# Patient Record
Sex: Male | Born: 1975 | Race: Black or African American | Hispanic: No | Marital: Single | Smoking: Current every day smoker
Health system: Southern US, Community
[De-identification: ages and names within clinical notes are randomized; demographics above are authoritative.]

## PROBLEM LIST (undated history)

## (undated) DIAGNOSIS — R55 Syncope and collapse: Secondary | ICD-10-CM

## (undated) HISTORY — PX: HERNIA REPAIR: SHX51

---

## 1997-08-24 ENCOUNTER — Emergency Department (HOSPITAL_COMMUNITY): Admission: EM | Admit: 1997-08-24 | Discharge: 1997-08-24 | Payer: Self-pay | Admitting: Emergency Medicine

## 1998-03-11 ENCOUNTER — Encounter (HOSPITAL_BASED_OUTPATIENT_CLINIC_OR_DEPARTMENT_OTHER): Payer: Self-pay | Admitting: General Surgery

## 1998-03-13 ENCOUNTER — Ambulatory Visit (HOSPITAL_COMMUNITY): Admission: RE | Admit: 1998-03-13 | Discharge: 1998-03-13 | Payer: Self-pay | Admitting: General Surgery

## 1999-06-04 ENCOUNTER — Emergency Department (HOSPITAL_COMMUNITY): Admission: EM | Admit: 1999-06-04 | Discharge: 1999-06-04 | Payer: Self-pay | Admitting: Emergency Medicine

## 1999-08-12 ENCOUNTER — Emergency Department (HOSPITAL_COMMUNITY): Admission: EM | Admit: 1999-08-12 | Discharge: 1999-08-13 | Payer: Self-pay | Admitting: Emergency Medicine

## 1999-08-13 ENCOUNTER — Encounter: Payer: Self-pay | Admitting: Emergency Medicine

## 2001-08-17 ENCOUNTER — Encounter: Payer: Self-pay | Admitting: Emergency Medicine

## 2001-08-17 ENCOUNTER — Emergency Department (HOSPITAL_COMMUNITY): Admission: EM | Admit: 2001-08-17 | Discharge: 2001-08-17 | Payer: Self-pay | Admitting: Emergency Medicine

## 2001-08-23 ENCOUNTER — Emergency Department (HOSPITAL_COMMUNITY): Admission: EM | Admit: 2001-08-23 | Discharge: 2001-08-23 | Payer: Self-pay | Admitting: Emergency Medicine

## 2004-06-09 ENCOUNTER — Emergency Department (HOSPITAL_COMMUNITY): Admission: EM | Admit: 2004-06-09 | Discharge: 2004-06-09 | Payer: Self-pay | Admitting: Emergency Medicine

## 2005-03-07 ENCOUNTER — Emergency Department (HOSPITAL_COMMUNITY): Admission: EM | Admit: 2005-03-07 | Discharge: 2005-03-07 | Payer: Self-pay | Admitting: Emergency Medicine

## 2006-03-28 ENCOUNTER — Emergency Department (HOSPITAL_COMMUNITY): Admission: EM | Admit: 2006-03-28 | Discharge: 2006-03-28 | Payer: Self-pay | Admitting: Emergency Medicine

## 2006-04-28 ENCOUNTER — Emergency Department (HOSPITAL_COMMUNITY): Admission: EM | Admit: 2006-04-28 | Discharge: 2006-04-28 | Payer: Self-pay | Admitting: Emergency Medicine

## 2006-05-05 ENCOUNTER — Emergency Department (HOSPITAL_COMMUNITY): Admission: EM | Admit: 2006-05-05 | Discharge: 2006-05-06 | Payer: Self-pay | Admitting: Emergency Medicine

## 2006-12-13 ENCOUNTER — Emergency Department (HOSPITAL_COMMUNITY): Admission: EM | Admit: 2006-12-13 | Discharge: 2006-12-13 | Payer: Self-pay | Admitting: *Deleted

## 2006-12-15 ENCOUNTER — Emergency Department (HOSPITAL_COMMUNITY): Admission: EM | Admit: 2006-12-15 | Discharge: 2006-12-15 | Payer: Self-pay | Admitting: *Deleted

## 2007-07-20 ENCOUNTER — Emergency Department (HOSPITAL_COMMUNITY): Admission: EM | Admit: 2007-07-20 | Discharge: 2007-07-21 | Payer: Self-pay | Admitting: Emergency Medicine

## 2007-07-29 ENCOUNTER — Emergency Department (HOSPITAL_COMMUNITY): Admission: EM | Admit: 2007-07-29 | Discharge: 2007-07-29 | Payer: Self-pay | Admitting: Emergency Medicine

## 2009-01-14 ENCOUNTER — Emergency Department (HOSPITAL_COMMUNITY): Admission: EM | Admit: 2009-01-14 | Discharge: 2009-01-14 | Payer: Self-pay | Admitting: Emergency Medicine

## 2009-04-07 ENCOUNTER — Emergency Department (HOSPITAL_COMMUNITY): Admission: EM | Admit: 2009-04-07 | Discharge: 2009-04-07 | Payer: Self-pay | Admitting: Emergency Medicine

## 2013-01-21 ENCOUNTER — Encounter (HOSPITAL_COMMUNITY): Payer: Self-pay | Admitting: Emergency Medicine

## 2013-01-21 ENCOUNTER — Emergency Department (HOSPITAL_COMMUNITY)
Admission: EM | Admit: 2013-01-21 | Discharge: 2013-01-21 | Disposition: A | Discharge status: Home / Self Care | Payer: Self-pay | Attending: Emergency Medicine | Admitting: Emergency Medicine

## 2013-01-21 DIAGNOSIS — R209 Unspecified disturbances of skin sensation: Secondary | ICD-10-CM | POA: Insufficient documentation

## 2013-01-21 DIAGNOSIS — F17200 Nicotine dependence, unspecified, uncomplicated: Secondary | ICD-10-CM | POA: Insufficient documentation

## 2013-01-21 DIAGNOSIS — W503XXA Accidental bite by another person, initial encounter: Secondary | ICD-10-CM

## 2013-01-21 DIAGNOSIS — Z23 Encounter for immunization: Secondary | ICD-10-CM | POA: Insufficient documentation

## 2013-01-21 DIAGNOSIS — S61209A Unspecified open wound of unspecified finger without damage to nail, initial encounter: Secondary | ICD-10-CM | POA: Insufficient documentation

## 2013-01-21 DIAGNOSIS — M79609 Pain in unspecified limb: Secondary | ICD-10-CM | POA: Insufficient documentation

## 2013-01-21 DIAGNOSIS — M79644 Pain in right finger(s): Secondary | ICD-10-CM

## 2013-01-21 MED ORDER — CIPROFLOXACIN HCL 500 MG PO TABS: 500.0000 mg | ORAL_TABLET | Freq: Two times a day (BID) | ORAL | Status: DC

## 2013-01-21 MED ORDER — POVIDONE-IODINE 10 % EX SOLN: CUTANEOUS | Status: DC | PRN

## 2013-01-21 MED ORDER — CLINDAMYCIN HCL 150 MG PO CAPS: 300.0000 mg | ORAL_CAPSULE | Freq: Four times a day (QID) | ORAL | Status: DC

## 2013-01-21 MED ORDER — TETANUS-DIPHTH-ACELL PERTUSSIS 5-2.5-18.5 LF-MCG/0.5 IM SUSP
0.5000 mL | Freq: Once | INTRAMUSCULAR | Status: AC
  Administered 2013-01-21: 0.5 mL via INTRAMUSCULAR
  Filled 2013-01-21: qty 0.5
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
## 2013-01-21 NOTE — ED Provider Notes (Signed)
Medical screening examination/treatment/procedure(s) were conducted as a shared visit with non-physician practitioner(s) and myself.  I personally evaluated the patient during the encounter.  EKG Interpretation   None         Joya Gaskins, MD 01/21/13 [phone]
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
## 2013-01-21 NOTE — ED Notes (Signed)
pts finger rt thumb scrubbed w/ betadine and dressed w/ dsd

## 2013-11-13 ENCOUNTER — Ambulatory Visit (HOSPITAL_COMMUNITY): Admission: EM | Admit: 2013-11-13 | Payer: Self-pay | Source: Home / Self Care | Admitting: Emergency Medicine

## 2013-11-13 ENCOUNTER — Emergency Department (HOSPITAL_COMMUNITY): Payer: Self-pay

## 2013-11-13 ENCOUNTER — Inpatient Hospital Stay (HOSPITAL_COMMUNITY)
Admission: EM | Admit: 2013-11-13 | Discharge: 2013-11-15 | DRG: 312 | Disposition: A | Discharge status: Home / Self Care | Payer: Self-pay | Attending: Internal Medicine | Admitting: Internal Medicine

## 2013-11-13 ENCOUNTER — Encounter (HOSPITAL_COMMUNITY): Payer: Self-pay | Admitting: Emergency Medicine

## 2013-11-13 DIAGNOSIS — Z23 Encounter for immunization: Secondary | ICD-10-CM

## 2013-11-13 DIAGNOSIS — F1010 Alcohol abuse, uncomplicated: Secondary | ICD-10-CM | POA: Diagnosis present

## 2013-11-13 DIAGNOSIS — F1490 Cocaine use, unspecified, uncomplicated: Secondary | ICD-10-CM

## 2013-11-13 DIAGNOSIS — F1410 Cocaine abuse, uncomplicated: Secondary | ICD-10-CM | POA: Diagnosis present

## 2013-11-13 DIAGNOSIS — F17200 Nicotine dependence, unspecified, uncomplicated: Secondary | ICD-10-CM | POA: Diagnosis present

## 2013-11-13 DIAGNOSIS — R55 Syncope and collapse: Principal | ICD-10-CM | POA: Diagnosis present

## 2013-11-13 HISTORY — DX: Syncope and collapse: R55

## 2013-11-13 LAB — I-STAT TROPONIN, ED: TROPONIN I, POC: 0.01 ng/mL (ref 0.00–0.08)

## 2013-11-13 LAB — CBC
HEMATOCRIT: 42.5 % (ref 39.0–52.0)
HEMOGLOBIN: 14.3 g/dL (ref 13.0–17.0)
MCH: 25.3 pg — ABNORMAL LOW (ref 26.0–34.0)
MCHC: 33.6 g/dL (ref 30.0–36.0)
MCV: 75.2 fL — ABNORMAL LOW (ref 78.0–100.0)
Platelets: 231 10*3/uL (ref 150–400)
RBC: 5.65 MIL/uL (ref 4.22–5.81)
RDW: 14.9 % (ref 11.5–15.5)
WBC: 5.4 10*3/uL (ref 4.0–10.5)

## 2013-11-13 LAB — CBG MONITORING, ED: Glucose-Capillary: 150 mg/dL — ABNORMAL HIGH (ref 70–99)

## 2013-11-13 SURGERY — LEFT HEART CATH
Anesthesia: LOCAL

## 2013-11-13 NOTE — ED Notes (Signed)
Per EMS: pt coming from home with c/o syncopal episode. Upon EMS arrival pt was diaphoretic, EKG showed ST elevation. Pt admits to cocaine abuse. Pt denies chest pains. Pt c/o neck pain. Pt was given 325 asa.

## 2013-11-13 NOTE — ED Notes (Signed)
Cardiologist at bedside. Code STEMI cancelled
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
## 2013-11-13 NOTE — ED Provider Notes (Signed)
CSN: [id number]     Arrival date & time 11/13/13  2252 History   First MD Initiated Contact with Patient 11/13/13 2257     Chief Complaint  Patient presents with  . Code STEMI    Patient is a 38 y.o. male presenting with syncope. The history is provided by the patient and the EMS personnel.  Loss of Consciousness Episode history:  Single Most recent episode:  Today Duration:  1 hour Progression:  Resolved Chronicity:  New Context comment:  Eating sitting down Witnessed: yes   Relieved by: rest. Associated symptoms: diaphoresis, nausea (resolved) and visual change (room closed in)   Associated symptoms: no chest pain, no difficulty breathing, no dizziness, no fever, no headaches, no malaise/fatigue, no seizures, no shortness of breath, no vomiting and no weakness    Pt endorsed cocaine use yesterday, no drug or alcohol use today. Syncopized witnessed at rest.  EMS state pt was very diaphoretic on arrival and hypotensive with improvement in BP without intervention.  ASA  given. ST elevations noted on EKG so in coded with STEMI.  Besides midline neck pain, pt has no symptoms currently.  No leg swelling, cough, fevers, hemoptysis. Pt states he has never had an EKG before.    History reviewed. No pertinent past medical history. History reviewed. No pertinent past surgical history. History reviewed. No pertinent family history. History  Substance Use Topics  . Smoking status: Current Every Day Smoker -- 0.50 packs/day    Types: Cigarettes  . Smokeless tobacco: Not on file  . Alcohol Use: Yes    Review of Systems  Constitutional: Positive for diaphoresis. Negative for fever, chills and malaise/fatigue.  Respiratory: Negative for cough, shortness of breath and wheezing.   Cardiovascular: Positive for syncope. Negative for chest pain.  Gastrointestinal: Positive for nausea (resolved). Negative for vomiting and abdominal pain.  Musculoskeletal: Positive for neck pain. Negative for  back pain and gait problem.  Skin: Negative for rash.  Neurological: Positive for syncope and light-headedness. Negative for dizziness, seizures, facial asymmetry, weakness, numbness and headaches.  All other systems reviewed and are negative.   Allergies  Review of patient's allergies indicates no known allergies.  Home Medications   Prior to Admission medications   Medication Sig Start Date End Date Taking? Authorizing Provider  ciprofloxacin (CIPRO) 500 MG tablet Take 1 tablet (500 mg total) by mouth 2 (two) times daily. One po bid x 7 days 01/21/13   Mora Bellman, PA-C  clindamycin (CLEOCIN) 150 MG capsule Take 2 capsules (300 mg total) by mouth 4 (four) times daily. May dispense as  capsules 01/21/13   Ramon Dredge Merrell, PA-C   BP 132/70  Pulse 74  Resp 24  Ht  (1.93 m)  Wt 165 lb (74.844 kg)  BMI 20.09 kg/m2  SpO2 100% Physical Exam  Nursing note and vitals reviewed. Constitutional: He is oriented to person, place, and time. He appears well-developed and well-nourished. No distress.  HENT:  Head: Normocephalic and atraumatic.  Nose: Nose normal.  Eyes: Conjunctivae are normal. Pupils are equal, round, and reactive to light.  Neck: Normal range of motion. Neck supple. No tracheal deviation present.  Collared, lateral and midline neck pain  Cardiovascular: Normal rate, regular rhythm and normal heart sounds.   No murmur heard. Pulmonary/Chest: Effort normal and breath sounds normal. No respiratory distress. He has no wheezes. He has no rales. He exhibits no tenderness.  Abdominal: Soft. Bowel sounds are normal. He exhibits no distension and no  mass. There is no tenderness.  Musculoskeletal: Normal range of motion. He exhibits no edema.  No lower extremity edema, calf tenderness, warmth, erythema or palpable cords  Neurological: He is alert and oriented to person, place, and time.  Normal strength and sensation. DTRs equal 2+. Toes flexor  Skin: Skin is warm and  dry. No rash noted. He is not diaphoretic.  Psychiatric: He has a normal mood and affect.    ED Course  Procedures (including critical care time) Labs Review Labs Reviewed  CBC - Abnormal; Notable for the following:    MCV 75.2 (*)    MCH 25.3 (*)    All other components within normal limits  BASIC METABOLIC PANEL - Abnormal; Notable for the following:    Glucose, Bld 150 (*)    GFR calc non Af Amer 71 (*)    GFR calc Af Amer 82 (*)    All other components within normal limits  CBG MONITORING, ED - Abnormal; Notable for the following:    Glucose-Capillary 150 (*)    All other components within normal limits  I-STAT TROPOININ, ED    Imaging Review Ct Head Wo Contrast  11/14/2013   CLINICAL DATA:  Code STEMI.  Fall.  EXAM: CT HEAD WITHOUT CONTRAST  CT CERVICAL SPINE WITHOUT CONTRAST  TECHNIQUE: Multidetector CT imaging of the head and cervical spine was performed following the standard protocol without intravenous contrast. Multiplanar CT image reconstructions of the cervical spine were also generated.  COMPARISON:  None currently available  FINDINGS: CT HEAD FINDINGS  Skull and Sinuses:Negative for fracture or destructive process. The mastoids, middle ears, and imaged paranasal sinuses are clear.  Orbits: No acute abnormality.  Brain: No evidence of acute abnormality, such as acute infarction, hemorrhage, hydrocephalus, or mass lesion/mass effect.  CT CERVICAL SPINE FINDINGS  Negative for acute fracture or subluxation. No prevertebral edema. No gross cervical canal hematoma. Mild C3-4 disc narrowing and bulging with early uncovertebral spurring. No significant osseous canal or foraminal stenosis.  IMPRESSION: Negative head and cervical spine CT.   Electronically Signed   By: Tiburcio Pea M.D.   On: 11/14/2013 00:04   Ct Cervical Spine Wo Contrast  11/14/2013   CLINICAL DATA:  Code STEMI.  Fall.  EXAM: CT HEAD WITHOUT CONTRAST  CT CERVICAL SPINE WITHOUT CONTRAST  TECHNIQUE: Multidetector  CT imaging of the head and cervical spine was performed following the standard protocol without intravenous contrast. Multiplanar CT image reconstructions of the cervical spine were also generated.  COMPARISON:  None currently available  FINDINGS: CT HEAD FINDINGS  Skull and Sinuses:Negative for fracture or destructive process. The mastoids, middle ears, and imaged paranasal sinuses are clear.  Orbits: No acute abnormality.  Brain: No evidence of acute abnormality, such as acute infarction, hemorrhage, hydrocephalus, or mass lesion/mass effect.  CT CERVICAL SPINE FINDINGS  Negative for acute fracture or subluxation. No prevertebral edema. No gross cervical canal hematoma. Mild C3-4 disc narrowing and bulging with early uncovertebral spurring. No significant osseous canal or foraminal stenosis.  IMPRESSION: Negative head and cervical spine CT.   Electronically Signed   By: Tiburcio Pea M.D.   On: 11/14/2013 00:04   Dg Chest Portable 1 View  11/13/2013   CLINICAL DATA:  Code STEMI.  EXAM: PORTABLE CHEST - 1 VIEW  COMPARISON:  05/06/2006.  FINDINGS: Normal heart size and mediastinal contours. No acute infiltrate or edema. No effusion or pneumothorax. No acute osseous findings.  IMPRESSION: Negative chest.   Electronically Signed  By: Tiburcio Pea M.D.   On: 11/13/2013 23:38     EKG Interpretation   Date/Time:  Wednesday November 13 2013 22:56:25 EDT Ventricular Rate:  75 PR Interval:  136 QRS Duration: 78 QT Interval:  373 QTC Calculation: 417 R Axis:   68 Text Interpretation:  Sinus rhythm Left ventricular hypertrophy ST  elevation, consider anterolateral injury - similar to a few minutes prior  Confirmed by Gwendolyn Grant  MD, BLAIR ([id number]) on 11/13/2013 11:29:50 PM      MDM   Final diagnoses:  Vasovagal syncope  Cocaine use    Pt with substance abuse who presents s/p syncope at rest.  Diaphoresis and hypotension rapidly improved without intervention. Pt is currently asymptomatic.   Cardiology at bedside on arrival.  Pads placed.  EKG obtained with diffuse J point elevations, no reciprocal changes. Pt has no prior EKGs to compare.   Cardiology canceled code STEMI. Recommend admission for further obs and delta trop. Would expect cocaine to be metabolized by this point from prior use.  No evidence of DVT, no hemoptysis, pleurisy, chest pain, hypoxia or tachypnea to indicate PE.  No chest pain or PR depression to indicate pericarditis. VSS, well appearing.  Larey Seat and struck head after syncope and notes neck pain. Images obtained.    12:25 AM images negative. Cervical spine cleared. Pain is lateral neck, suspect cervical strain.  Cardiology recommends admission for further observation and cycle troponins. Hospitalist called and will admit. Pt is chest pain free and asymptomatic. VSS.     Sofie Rower, MD 11/14/13 [phone]

## 2013-11-14 ENCOUNTER — Encounter (HOSPITAL_COMMUNITY): Payer: Self-pay | Admitting: General Practice

## 2013-11-14 DIAGNOSIS — R55 Syncope and collapse: Principal | ICD-10-CM

## 2013-11-14 LAB — BASIC METABOLIC PANEL
Anion gap: 15 (ref 5–15)
Anion gap: 13 (ref 5–15)
BUN: 18 mg/dL (ref 6–23)
BUN: 19 mg/dL (ref 6–23)
CHLORIDE: 98 meq/L (ref 96–112)
CHLORIDE: 101 meq/L (ref 96–112)
CO2: 24 mEq/L (ref 19–32)
CO2: 27 mEq/L (ref 19–32)
Calcium: 9.5 mg/dL (ref 8.4–10.5)
Calcium: 10 mg/dL (ref 8.4–10.5)
Creatinine, Ser: 1.26 mg/dL (ref 0.50–1.35)
Creatinine, Ser: 1.29 mg/dL (ref 0.50–1.35)
GFR calc Af Amer: 82 mL/min — ABNORMAL LOW (ref >90)
GFR calc Af Amer: 80 mL/min — ABNORMAL LOW (ref >90)
GFR calc non Af Amer: 69 mL/min — ABNORMAL LOW (ref >90)
GFR, EST NON AFRICAN AMERICAN: 71 mL/min — AB (ref >90)
GLUCOSE: 71 mg/dL (ref 70–99)
Glucose, Bld: 150 mg/dL — ABNORMAL HIGH (ref 70–99)
POTASSIUM: 4 meq/L (ref 3.7–5.3)
POTASSIUM: 5 meq/L (ref 3.7–5.3)
SODIUM: 137 meq/L (ref 137–147)
SODIUM: 141 meq/L (ref 137–147)

## 2013-11-14 LAB — CBC
HCT: 41.8 % (ref 39.0–52.0)
Hemoglobin: 13.7 g/dL (ref 13.0–17.0)
MCH: 25.4 pg — AB (ref 26.0–34.0)
MCHC: 32.8 g/dL (ref 30.0–36.0)
MCV: 77.4 fL — ABNORMAL LOW (ref 78.0–100.0)
PLATELETS: 240 10*3/uL (ref 150–400)
RBC: 5.4 MIL/uL (ref 4.22–5.81)
RDW: 15.2 % (ref 11.5–15.5)
WBC: 7.4 10*3/uL (ref 4.0–10.5)

## 2013-11-14 LAB — LIPID PANEL
Cholesterol: 162 mg/dL (ref 0–200)
HDL: 87 mg/dL (ref >39)
LDL Cholesterol: 64 mg/dL (ref 0–99)
Total CHOL/HDL Ratio: 1.9 RATIO
Triglycerides: 54 mg/dL (ref <150)
VLDL: 11 mg/dL (ref 0–40)

## 2013-11-14 LAB — HEMOGLOBIN A1C
HEMOGLOBIN A1C: 5.2 % (ref <5.7)
MEAN PLASMA GLUCOSE: 103 mg/dL (ref <117)

## 2013-11-14 LAB — TROPONIN I
Troponin I: <0.3 ng/mL (ref <0.30)
Troponin I: <0.3 ng/mL (ref <0.30)
Troponin I: <0.3 ng/mL (ref <0.30)

## 2013-11-14 LAB — CREATININE, SERUM
CREATININE: 1.29 mg/dL (ref 0.50–1.35)
GFR calc Af Amer: 80 mL/min — ABNORMAL LOW (ref >90)
GFR calc non Af Amer: 69 mL/min — ABNORMAL LOW (ref >90)

## 2013-11-14 LAB — TSH: TSH: 0.12 u[IU]/mL — AB (ref 0.350–4.500)

## 2013-11-14 LAB — HIV ANTIBODY (ROUTINE TESTING W REFLEX): HIV 1&2 Ab, 4th Generation: NONREACTIVE

## 2013-11-14 LAB — T4, FREE: FREE T4: 1.01 ng/dL (ref 0.80–1.80)

## 2013-11-14 MED ORDER — ADULT MULTIVITAMIN W/MINERALS CH
1.0000 | ORAL_TABLET | Freq: Every day | ORAL | Status: DC
  Administered 2013-11-14 – 2013-11-15 (×2): 1 via ORAL
  Filled 2013-11-14 (×2): qty 1

## 2013-11-14 MED ORDER — LORAZEPAM 2 MG/ML IJ SOLN: 1.0000 mg | Freq: Four times a day (QID) | INTRAMUSCULAR | Status: DC | PRN

## 2013-11-14 MED ORDER — SODIUM CHLORIDE 0.9 % IJ SOLN
3.0000 mL | Freq: Two times a day (BID) | INTRAMUSCULAR | Status: DC
  Administered 2013-11-15 (×2): 3 mL via INTRAVENOUS

## 2013-11-14 MED ORDER — INFLUENZA VAC SPLIT QUAD 0.5 ML IM SUSY
0.5000 mL | PREFILLED_SYRINGE | INTRAMUSCULAR | Status: AC
  Administered 2013-11-15: 0.5 mL via INTRAMUSCULAR
  Filled 2013-11-14: qty 0.5

## 2013-11-14 MED ORDER — VITAMIN B-1 100 MG PO TABS
100.0000 mg | ORAL_TABLET | Freq: Every day | ORAL | Status: DC
  Administered 2013-11-14 – 2013-11-15 (×2): 100 mg via ORAL
  Filled 2013-11-14 (×2): qty 1

## 2013-11-14 MED ORDER — SODIUM CHLORIDE 0.9 % IV SOLN: 250.0000 mL | INTRAVENOUS | Status: DC | PRN

## 2013-11-14 MED ORDER — TRAMADOL HCL 50 MG PO TABS
50.0000 mg | ORAL_TABLET | Freq: Four times a day (QID) | ORAL | Status: DC | PRN
  Administered 2013-11-14 (×2): 50 mg via ORAL
  Filled 2013-11-14 (×2): qty 1

## 2013-11-14 MED ORDER — LORAZEPAM 1 MG PO TABS
1.0000 mg | ORAL_TABLET | Freq: Four times a day (QID) | ORAL | Status: DC | PRN
  Administered 2013-11-15: 1 mg via ORAL
  Filled 2013-11-14: qty 1

## 2013-11-14 MED ORDER — PNEUMOCOCCAL VAC POLYVALENT 25 MCG/0.5ML IJ INJ
0.5000 mL | INJECTION | INTRAMUSCULAR | Status: AC
  Administered 2013-11-15: 0.5 mL via INTRAMUSCULAR
  Filled 2013-11-14: qty 0.5

## 2013-11-14 MED ORDER — ENOXAPARIN SODIUM 40 MG/0.4ML ~~LOC~~ SOLN
40.0000 mg | SUBCUTANEOUS | Status: DC
  Administered 2013-11-14 – 2013-11-15 (×2): 40 mg via SUBCUTANEOUS
  Filled 2013-11-14 (×4): qty 0.4

## 2013-11-14 MED ORDER — SODIUM CHLORIDE 0.9 % IJ SOLN
3.0000 mL | Freq: Two times a day (BID) | INTRAMUSCULAR | Status: DC
  Administered 2013-11-15 (×2): 3 mL via INTRAVENOUS

## 2013-11-14 MED ORDER — SODIUM CHLORIDE 0.9 % IJ SOLN: 3.0000 mL | INTRAMUSCULAR | Status: DC | PRN

## 2013-11-14 MED ORDER — NICOTINE 14 MG/24HR TD PT24
14.0000 mg | MEDICATED_PATCH | Freq: Every day | TRANSDERMAL | Status: DC
  Administered 2013-11-14 – 2013-11-15 (×2): 14 mg via TRANSDERMAL
  Filled 2013-11-14 (×2): qty 1

## 2013-11-14 MED ORDER — FOLIC ACID 1 MG PO TABS
1.0000 mg | ORAL_TABLET | Freq: Every day | ORAL | Status: DC
  Administered 2013-11-14 – 2013-11-15 (×2): 1 mg via ORAL
  Filled 2013-11-14 (×2): qty 1

## 2013-11-14 MED ORDER — THIAMINE HCL 100 MG/ML IJ SOLN: 100.0000 mg | Freq: Every day | INTRAMUSCULAR | Status: DC

## 2013-11-14 NOTE — Progress Notes (Addendum)
Patient admitted after midnight- please see H&P- in study Syncope - A1C, lipids. CUS. TTE. Tele overnight. Cycle trop.  tob abuse - nicotine patc etoh abuse - ciwa  Drug abuse - UDS , social work consult Fall - CT head / neck unremarkable  TSH decreased- add free t4  Suspect d/c tomm  Marlin Canary DO

## 2013-11-14 NOTE — Progress Notes (Signed)
  Echocardiogram 2D Echocardiogram has been performed.  Cathie Beams 11/14/2013, 12:20 PM

## 2013-11-14 NOTE — Progress Notes (Signed)
Alert and oriented times 4, able to communicate needs, will continue to monitor

## 2013-11-14 NOTE — Care Management Note (Signed)
    Page 1 of 1   11/14/2013     2:39:53 PM CARE MANAGEMENT NOTE 11/14/2013  Patient:  Michael Rowland, Michael Rowland   Account Number:  [id number]  Date Initiated:  11/14/2013  Documentation initiated by:  [facility]  Subjective/Objective Assessment:   38 yo presents w/ syncope while eating a meal. Larey Seat from his chair and awoke on the floor.// Home alone.     Action/Plan:   A1C, lipids. CUS. TTE. Tele overnight. Cycle trop.  tob abuse - nicotine patch if needed  etoh abuse - ciwa  Drug abuse - UDS  //Access for disposition needs, PCP set-up.   Anticipated DC Date:  11/15/2013   Anticipated DC Plan:  HOME/SELF CARE  In-house referral  Clinical Social Worker      DC Planning Services  CM consult  Medication Assistance      Choice offered to / List presented to:             Status of service:  In process, will continue to follow Medicare Important Message given?   (If response is "NO", the following Medicare IM given date fields will be blank) Date Medicare IM given:   Medicare IM given by:   Date Additional Medicare IM given:   Additional Medicare IM given by:    Discharge Disposition:    Per UR Regulation:  Reviewed for med. necessity/level of care/duration of stay  If discussed at Long Length of Stay Meetings, dates discussed:    Comments:

## 2013-11-14 NOTE — H&P (Signed)
[facility]  History and Physical Mariko Nowakowski L. Link Snuffer, MD Pager [phone] (if 7P to 7A, page night hospitalist on [email or web address] MONDAY AVW:[phone] DOB: [birth year] DOA: 11/13/2013  Referring physician: ED PCP: No primary provider on file.   Chief Complaint: syncope  HPI:  Pt w/ minimal known PMH w/o a prior PCP so minimal prior health exams presents w/ syncope while eating a meal. Fell from his chair and awoke on the floor. Never had CP, dyspnea, pain. Did have diaphoresis. No N/V. On EKG had diffuse J point elevation and code stemi was evaluated and called off by cardiology. He does have hx of drug use, most recent cocaine was 1 day prior. Etoh use current. tob use current at 1 PPD. Unknown if has other cardiac risk factors.   Chart Review:  ED chart review   Review of Systems:  Neg except as noted above   History reviewed. No pertinent past medical history.  History reviewed. No pertinent past surgical history.  Social History:  reports that he has been smoking Cigarettes.  He has been smoking about 0.50 packs per day. He does not have any smokeless tobacco history on file. He reports that he drinks alcohol. He reports that he uses illicit drugs (Cocaine).  No Known Allergies  History reviewed. No pertinent family history.   Prior to Admission medications   Not on File   Physical Exam: Filed Vitals:   11/13/13 2259  BP: 132/70  Pulse: 74  Resp: 24  Height:  (1.93 m)  Weight: 165 lb (74.844 kg)  SpO2: 100%     General:  AAM in NAD   HEENT: MMM, anicteric, trachea midlien  Cardiovascular: RRR,  No MRG   Respiratory: CTAB, no wheezes, rales   Abdomen: soft, NT, ND  Skin: warm , dry   Musculoskeletal: no focal deficits   Psychiatric: no anxiety   Neurologic: no focal deficits   Wt Readings from Last 3 Encounters:  11/13/13 165 lb (74.844 kg)  01/21/13 158 lb (71.668 kg)    Labs on Admission:  Basic Metabolic Panel:  Recent  Labs Lab 11/13/13 2308  NA 137  K 4.0  CL 98  CO2 24  GLUCOSE 150*  BUN 18  CREATININE 1.26  CALCIUM 9.5    Liver Function Tests: No results found for this basename: AST, ALT, ALKPHOS, BILITOT, PROT, ALBUMIN,  in the last 168 hours No results found for this basename: LIPASE, AMYLASE,  in the last 168 hours No results found for this basename: AMMONIA,  in the last 168 hours  CBC:  Recent Labs Lab 11/13/13 2308  WBC 5.4  HGB 14.3  HCT 42.5  MCV 75.2*  PLT 231    Cardiac Enzymes: No results found for this basename: CKTOTAL, CKMB, CKMBINDEX, TROPONINI,  in the last 168 hours  Troponin (Point of Care Test)  Recent Labs  11/13/13 2304  TROPIPOC 0.01    BNP (last 3 results) No results found for this basename: PROBNP,  in the last 8760 hours  CBG:  Recent Labs Lab 11/13/13 2305  GLUCAP 150*     Radiological Exams on Admission: Ct Head Wo Contrast  11/14/2013   CLINICAL DATA:  Code STEMI.  Fall.  EXAM: CT HEAD WITHOUT CONTRAST  CT CERVICAL SPINE WITHOUT CONTRAST  TECHNIQUE: Multidetector CT imaging of the head and cervical spine was performed following the standard protocol without intravenous contrast. Multiplanar CT image reconstructions of the cervical spine were also generated.  COMPARISON:  None currently available  FINDINGS: CT HEAD FINDINGS  Skull and Sinuses:Negative for fracture or destructive process. The mastoids, middle ears, and imaged paranasal sinuses are clear.  Orbits: No acute abnormality.  Brain: No evidence of acute abnormality, such as acute infarction, hemorrhage, hydrocephalus, or mass lesion/mass effect.  CT CERVICAL SPINE FINDINGS  Negative for acute fracture or subluxation. No prevertebral edema. No gross cervical canal hematoma. Mild C3-4 disc narrowing and bulging with early uncovertebral spurring. No significant osseous canal or foraminal stenosis.  IMPRESSION: Negative head and cervical spine CT.   Electronically Signed   By: Tiburcio Pea  M.D.   On: 11/14/2013 00:04   Ct Cervical Spine Wo Contrast  11/14/2013   CLINICAL DATA:  Code STEMI.  Fall.  EXAM: CT HEAD WITHOUT CONTRAST  CT CERVICAL SPINE WITHOUT CONTRAST  TECHNIQUE: Multidetector CT imaging of the head and cervical spine was performed following the standard protocol without intravenous contrast. Multiplanar CT image reconstructions of the cervical spine were also generated.  COMPARISON:  None currently available  FINDINGS: CT HEAD FINDINGS  Skull and Sinuses:Negative for fracture or destructive process. The mastoids, middle ears, and imaged paranasal sinuses are clear.  Orbits: No acute abnormality.  Brain: No evidence of acute abnormality, such as acute infarction, hemorrhage, hydrocephalus, or mass lesion/mass effect.  CT CERVICAL SPINE FINDINGS  Negative for acute fracture or subluxation. No prevertebral edema. No gross cervical canal hematoma. Mild C3-4 disc narrowing and bulging with early uncovertebral spurring. No significant osseous canal or foraminal stenosis.  IMPRESSION: Negative head and cervical spine CT.   Electronically Signed   By: Tiburcio Pea M.D.   On: 11/14/2013 00:04   Dg Chest Portable 1 View  11/13/2013   CLINICAL DATA:  Code STEMI.  EXAM: PORTABLE CHEST - 1 VIEW  COMPARISON:  05/06/2006.  FINDINGS: Normal heart size and mediastinal contours. No acute infiltrate or edema. No effusion or pneumothorax. No acute osseous findings.  IMPRESSION: Negative chest.   Electronically Signed   By: Tiburcio Pea M.D.   On: 11/13/2013 23:38     Active Problems:   * No active hospital problems. *   Assessment/Plan 1. Syncope - A1C, lipids. CUS. TTE. Tele overnight. Cycle trop.  2. tob abuse - nicotine patch if needed 3. etoh abuse - ciwa  4. Drug abuse - UDS  5. Fall - CT head / neck unremarkable .   Code Status: full Family Communication: none Disposition Plan/Anticipated LOS: 1-2 days   Time spent: 45  minutes  Alysia Penna, MD  Internal  Medicine Pager [phone] If 7PM-7AM, please contact night-coverage at [email or web address], password [facility] 11/14/2013, 12:37 AM

## 2013-11-14 NOTE — ED Notes (Signed)
Attempted report x 2 

## 2013-11-14 NOTE — Progress Notes (Signed)
VASCULAR LAB PRELIMINARY  PRELIMINARY  PRELIMINARY  PRELIMINARY  Carotid duplex  completed.    Preliminary report:  Bilateral:  1-39% ICA stenosis.  Vertebral artery flow is antegrade.      Juanitta Earnhardt, RVT 11/14/2013, 2:02 PM

## 2013-11-14 NOTE — ED Provider Notes (Signed)
I saw and evaluated the patient, reviewed the resident's note and I agree with the findings and plan.   EKG Interpretation   Date/Time:  Wednesday November 13 2013 22:56:25 EDT Ventricular Rate:  75 PR Interval:  136 QRS Duration: 78 QT Interval:  373 QTC Calculation: 417 R Axis:   68 Text Interpretation:  Sinus rhythm Left ventricular hypertrophy ST  elevation, consider anterolateral injury - similar to a few minutes prior  Confirmed by Gwendolyn Grant  MD, Tabathia Knoche ([id number]) on 11/13/2013 11:29:50 PM      Here as Code STEMI, however story more c/w vasovagal syncope. Endorses cocaine use. Patient feels well now, no CP or SOB on arrival. EKG c/w J-point elevation. Exam with neck pain, s/p fall from syncope, otherwise ok. Patient seen by Cards, they recommend admission for serial troponins. Medicine admitting.  Elwin Mocha, MD 11/14/13 [phone]

## 2013-11-14 NOTE — ED Notes (Signed)
MD at bedside. 

## 2013-11-14 NOTE — ED Notes (Signed)
Pt returned from CT with no apparent distress noted.

## 2013-11-14 NOTE — ED Notes (Signed)
Attempted Report x1.   

## 2013-11-15 LAB — RAPID URINE DRUG SCREEN, HOSP PERFORMED
AMPHETAMINES: NOT DETECTED
BENZODIAZEPINES: NOT DETECTED
Barbiturates: NOT DETECTED
Cocaine: POSITIVE — AB
OPIATES: NOT DETECTED
TETRAHYDROCANNABINOL: POSITIVE — AB

## 2013-11-15 LAB — BASIC METABOLIC PANEL
Anion gap: 10 (ref 5–15)
BUN: 11 mg/dL (ref 6–23)
CALCIUM: 9 mg/dL (ref 8.4–10.5)
CO2: 28 mEq/L (ref 19–32)
CREATININE: 1.01 mg/dL (ref 0.50–1.35)
Chloride: 103 mEq/L (ref 96–112)
GFR calc Af Amer: >90 mL/min (ref >90)
GLUCOSE: 83 mg/dL (ref 70–99)
POTASSIUM: 4.5 meq/L (ref 3.7–5.3)
Sodium: 141 mEq/L (ref 137–147)

## 2013-11-15 LAB — CBC
HEMATOCRIT: 36.3 % — AB (ref 39.0–52.0)
HEMOGLOBIN: 12.1 g/dL — AB (ref 13.0–17.0)
MCH: 25 pg — ABNORMAL LOW (ref 26.0–34.0)
MCHC: 33.3 g/dL (ref 30.0–36.0)
MCV: 75 fL — ABNORMAL LOW (ref 78.0–100.0)
Platelets: 225 10*3/uL (ref 150–400)
RBC: 4.84 MIL/uL (ref 4.22–5.81)
RDW: 15 % (ref 11.5–15.5)
WBC: 5 10*3/uL (ref 4.0–10.5)

## 2013-11-15 MED ORDER — HYDROCODONE-ACETAMINOPHEN 5-325 MG PO TABS: 1.0000 | ORAL_TABLET | ORAL | Status: DC | PRN

## 2013-11-15 NOTE — Discharge Instructions (Signed)
Syncope °Syncope is a medical term for fainting or passing out. This means you lose consciousness and drop to the ground. People are generally unconscious for less than 5 minutes. You may have some muscle twitches for up to 15 seconds before waking up and returning to normal. Syncope occurs more often in older adults, but it can happen to anyone. While most causes of syncope are not dangerous, syncope can be a sign of a serious medical problem. It is important to seek medical care.  °CAUSES  °Syncope is caused by a sudden drop in blood flow to the brain. The specific cause is often not determined. Factors that can bring on syncope include: °· Taking medicines that lower blood pressure. °· Sudden changes in posture, such as standing up quickly. °· Taking more medicine than prescribed. °· Standing in one place for too long. °· Seizure disorders. °· Dehydration and excessive exposure to heat. °· Low blood sugar (hypoglycemia). °· Straining to have a bowel movement. °· Heart disease, irregular heartbeat, or other circulatory problems. °· Fear, emotional distress, seeing blood, or severe pain. °SYMPTOMS  °Right before fainting, you may: °· Feel dizzy or light-headed. °· Feel nauseous. °· See all Venson or all black in your field of vision. °· Have cold, clammy skin. °DIAGNOSIS  °Your health care provider will ask about your symptoms, perform a physical exam, and perform an electrocardiogram (ECG) to record the electrical activity of your heart. Your health care provider may also perform other heart or blood tests to determine the cause of your syncope which may include: °· Transthoracic echocardiogram (TTE). During echocardiography, sound waves are used to evaluate how blood flows through your heart. °· Transesophageal echocardiogram (TEE). °· Cardiac monitoring. This allows your health care provider to monitor your heart rate and rhythm in real time. °· Holter monitor. This is a portable device that records your  heartbeat and can help diagnose heart arrhythmias. It allows your health care provider to track your heart activity for several days, if needed. °· Stress tests by exercise or by giving medicine that makes the heart beat faster. °TREATMENT  °In most cases, no treatment is needed. Depending on the cause of your syncope, your health care provider may recommend changing or stopping some of your medicines. °HOME CARE INSTRUCTIONS °· Have someone stay with you until you feel stable. °· Do not drive, use machinery, or play sports until your health care provider says it is okay. °· Keep all follow-up appointments as directed by your health care provider. °· Lie down right away if you start feeling like you might faint. Breathe deeply and steadily. Wait until all the symptoms have passed. °· Drink enough fluids to keep your urine clear or pale yellow. °· If you are taking blood pressure or heart medicine, get up slowly and take several minutes to sit and then stand. This can reduce dizziness. °SEEK IMMEDIATE MEDICAL CARE IF:  °· You have a severe headache. °· You have unusual pain in the chest, abdomen, or back. °· You are bleeding from your mouth or rectum, or you have black or tarry stool. °· You have an irregular or very fast heartbeat. °· You have pain with breathing. °· You have repeated fainting or seizure-like jerking during an episode. °· You faint when sitting or lying down. °· You have confusion. °· You have trouble walking. °· You have severe weakness. °· You have vision problems. °If you fainted, call your local emergency services (911 in [place] Do not drive   yourself to the hospital.  °MAKE SURE YOU: °· Understand these instructions. °· Will watch your condition. °· Will get help right away if you are not doing well or get worse. °Document Released: 02/07/2005 Document Revised: 02/12/2013 Document Reviewed: 04/08/2011 °Exit[employer]® Patient Information ©2015 [employer]. This information is not intended to replace  advice given to you by your health care provider. Make sure you discuss any questions you have with your health care provider. ° °

## 2013-11-15 NOTE — Discharge Summary (Signed)
Physician Discharge Summary  Michael Rowland ZOX:[id number] DOB: 09/27/1975 DOA: 11/13/2013  PCP: Pcp Not In System  Admit date: 11/13/2013 Discharge date: 11/15/2013  Recommendations for Outpatient Follow-up:  1. Pt will need to follow up with PCP in 2-3 weeks post discharge 2. Please obtain BMP to evaluate electrolytes and kidney function 3. Please also check CBC to evaluate Hg and Hct levels  Discharge Diagnoses:  Active Problems:   Syncope and collapse  Discharge Condition: Stable  Diet recommendation: Heart healthy diet discussed in details   History of present illness:  Pt w/ minimal known PMH w/o a prior PCP so minimal prior health exams presented w/ syncope while eating a meal. Fell from his chair and awoke on the floor. Never had CP, dyspnea, pain. Did have diaphoresis. No N/V. On EKG had diffuse J point elevation and code stemi was evaluated and called off by cardiology. He does have hx of drug use, most recent cocaine was 1 day prior to this admission. Etoh use current. tob use current at 1 PPD.  Hospital Course:  Active Problems:   Syncope and collapse  - unclear etiology and possibly due to dehydration in the setting of alcohol drinking and no water intake  - no events on telemetry overnight, CE x 3 negative - BP stable this AM, pt ambulating in hallway with no problems - wants to go home - recommend following with PCP in 2-3 weeks   Procedures/Studies: Ct Head Wo Contrast  11/14/2013    Negative head and cervical spine CT.   Ct Cervical Spine Wo Contrast  11/14/2013   Negative head and cervical spine CT.    Dg Chest Portable 1 View  11/13/2013   Negative chest.    Consultations:  None   Antibiotics:  None   Discharge Exam: Filed Vitals:   11/15/13 0657  BP: 106/69  Pulse: 60  Temp: 98.2 F (36.8 C)  Resp:    Filed Vitals:   11/14/13 1854 11/14/13 2028 11/15/13 0215 11/15/13 0657  BP: 101/68 114/69 105/58 106/69  Pulse: 70 69 63 60  Temp: 98.5 F  (36.9 C) 98.6 F (37 C) 98.3 F (36.8 C) 98.2 F (36.8 C)  TempSrc: Oral Oral Oral Oral  Resp:  16 18   Height:      Weight:    66.497 kg (146 lb 9.6 oz)  SpO2: 100% 99% 97% 100%    General: Pt is alert, follows commands appropriately, not in acute distress Cardiovascular: Regular rate and rhythm, S1/S2 +, no murmurs, no rubs, no gallops Respiratory: Clear to auscultation bilaterally, no wheezing, no crackles, no rhonchi Abdominal: Soft, non tender, non distended, bowel sounds +, no guarding Extremities: no edema, no cyanosis, pulses palpable bilaterally DP and PT Neuro: Grossly nonfocal  Discharge Instructions  Discharge Instructions   Diet - low sodium heart healthy    Complete by:  As directed      Increase activity slowly    Complete by:  As directed             Medication List         HYDROcodone-acetaminophen 5-325 MG per tablet  Commonly known as:  NORCO/VICODIN  Take 1 tablet by mouth every 4 (four) hours as needed for moderate pain.           Follow-up Information   Follow up with Debbora Presto, MD. (As needed, If symptoms worsen)    Specialty:  Internal Medicine   Contact information:   [address]  [address] [place] [address] [phone]        The results of significant diagnostics from this hospitalization (including imaging, microbiology, ancillary and laboratory) are listed below for reference.     Microbiology: No results found for this or any previous visit (from the past 240 hour(s)).   Labs: Basic Metabolic Panel:  Recent Labs Lab 11/13/13 2308 11/14/13 0204 11/14/13 0440 11/15/13 0444  NA 137  --  141 141  K 4.0  --  5.0 4.5  CL 98  --  101 103  CO2 24  --  27 28  GLUCOSE 150*  --  71 83  BUN 18  --  19 11  CREATININE 1.26 1.29 1.29 1.01  CALCIUM 9.5  --  10.0 9.0   Liver Function Tests: No results found for this basename: AST, ALT, ALKPHOS, BILITOT, PROT, ALBUMIN,  in the last 168 hours No results  found for this basename: LIPASE, AMYLASE,  in the last 168 hours No results found for this basename: AMMONIA,  in the last 168 hours CBC:  Recent Labs Lab 11/13/13 2308 11/14/13 0204 11/15/13 0444  WBC 5.4 7.4 5.0  HGB 14.3 13.7 12.1*  HCT 42.5 41.8 36.3*  MCV 75.2* 77.4* 75.0*  PLT 231 240 225   Cardiac Enzymes:  Recent Labs Lab 11/14/13 0150 11/14/13 1050 11/14/13 1900  TROPONINI <0.30 <0.30 <0.30   CBG:  Recent Labs Lab 11/13/13 2305  GLUCAP 150*     SIGNED: Time coordinating discharge: Over 30 minutes  Debbora Presto, MD  [facility] 11/15/2013, 10:06 AM Pager [phone]  If 7PM-7AM, please contact night-coverage [email or web address] Password [email or web address]

## 2014-08-05 ENCOUNTER — Emergency Department (HOSPITAL_COMMUNITY)
Admission: EM | Admit: 2014-08-05 | Discharge: 2014-08-06 | Disposition: A | Discharge status: Home / Self Care | Payer: Self-pay | Attending: Emergency Medicine | Admitting: Emergency Medicine

## 2014-08-05 ENCOUNTER — Encounter (HOSPITAL_COMMUNITY): Payer: Self-pay | Admitting: *Deleted

## 2014-08-05 DIAGNOSIS — S01112A Laceration without foreign body of left eyelid and periocular area, initial encounter: Secondary | ICD-10-CM | POA: Insufficient documentation

## 2014-08-05 DIAGNOSIS — Z720 Tobacco use: Secondary | ICD-10-CM | POA: Insufficient documentation

## 2014-08-05 DIAGNOSIS — Y999 Unspecified external cause status: Secondary | ICD-10-CM | POA: Insufficient documentation

## 2014-08-05 DIAGNOSIS — S0101XA Laceration without foreign body of scalp, initial encounter: Secondary | ICD-10-CM | POA: Insufficient documentation

## 2014-08-05 DIAGNOSIS — Y929 Unspecified place or not applicable: Secondary | ICD-10-CM | POA: Insufficient documentation

## 2014-08-05 DIAGNOSIS — IMO0002 Reserved for concepts with insufficient information to code with codable children: Secondary | ICD-10-CM

## 2014-08-05 DIAGNOSIS — Y939 Activity, unspecified: Secondary | ICD-10-CM | POA: Insufficient documentation

## 2014-08-05 DIAGNOSIS — Z23 Encounter for immunization: Secondary | ICD-10-CM | POA: Insufficient documentation

## 2014-08-05 DIAGNOSIS — W228XXA Striking against or struck by other objects, initial encounter: Secondary | ICD-10-CM | POA: Insufficient documentation

## 2014-08-05 MED ORDER — LIDOCAINE-EPINEPHRINE (PF) 2 %-1:200000 IJ SOLN
20.0000 mL | Freq: Once | INTRAMUSCULAR | Status: AC
  Administered 2014-08-05: 20 mL
  Filled 2014-08-05: qty 20

## 2014-08-05 MED ORDER — TETANUS-DIPHTH-ACELL PERTUSSIS 5-2.5-18.5 LF-MCG/0.5 IM SUSP
0.5000 mL | Freq: Once | INTRAMUSCULAR | Status: AC
  Administered 2014-08-05: 0.5 mL via INTRAMUSCULAR
  Filled 2014-08-05: qty 0.5
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
## 2014-08-06 ENCOUNTER — Emergency Department (HOSPITAL_COMMUNITY): Payer: Self-pay

## 2014-08-06 ENCOUNTER — Encounter (HOSPITAL_COMMUNITY): Payer: Self-pay

## 2014-08-06 MED ORDER — IBUPROFEN 600 MG PO TABS: 600.0000 mg | ORAL_TABLET | Freq: Four times a day (QID) | ORAL | Status: DC | PRN

## 2014-08-06 NOTE — ED Notes (Signed)
PA at bedside.

## 2014-08-06 NOTE — Discharge Instructions (Signed)
Sutured Wound Care Sutures are stitches that can be used to close wounds. Wound care helps prevent pain and infection.  HOME CARE INSTRUCTIONS   Rest and elevate the injured area until all the pain and swelling are gone.  Only take over-the-counter or prescription medicines for pain, discomfort, or fever as directed by your caregiver.  After 48 hours, gently wash the area with mild soap and water once a day, or as directed. Rinse off the soap. Pat the area dry with a clean towel. Do not rub the wound. This may cause bleeding.  Follow your caregiver's instructions for how often to change the bandage (dressing). Stop using a dressing after 2 days or after the wound stops draining.  If the dressing sticks, moisten it with soapy water and gently remove it.  Apply ointment on the wound as directed.  Avoid stretching a sutured wound.  Drink enough fluids to keep your urine clear or pale yellow.  Follow up with your caregiver for suture removal as directed.  Use sunscreen on your wound for the next 3 to 6 months so the scar will not darken. SEEK IMMEDIATE MEDICAL CARE IF:   Your wound becomes red, swollen, hot, or tender.  You have increasing pain in the wound.  You have a red streak that extends from the wound.  There is pus coming from the wound.  You have a fever.  You have shaking chills.  There is a bad smell coming from the wound.  You have persistent bleeding from the wound. MAKE SURE YOU:   Understand these instructions.  Will watch your condition.  Will get help right away if you are not doing well or get worse. Document Released: 03/17/2004 Document Revised: 05/02/2011 Document Reviewed: 06/13/2010 [facility] Patient Information 2015 [place], [place]. This information is not intended to replace advice given to you by your health care provider. Make sure you discuss any questions you have with your health care provider.  Head Injury You have received a head injury.  It does not appear serious at this time. Headaches and vomiting are common following head injury. It should be easy to awaken from sleeping. Sometimes it is necessary for you to stay in the emergency department for a while for observation. Sometimes admission to the hospital may be needed. After injuries such as yours, most problems occur within the first 24 hours, but side effects may occur up to 7-10 days after the injury. It is important for you to carefully monitor your condition and contact your health care provider or seek immediate medical care if there is a change in your condition. WHAT ARE THE TYPES OF HEAD INJURIES? Head injuries can be as minor as a bump. Some head injuries can be more severe. More severe head injuries include:  A jarring injury to the brain (concussion).  A bruise of the brain (contusion). This mean there is bleeding in the brain that can cause swelling.  A cracked skull (skull fracture).  Bleeding in the brain that collects, clots, and forms a bump (hematoma). WHAT CAUSES A HEAD INJURY? A serious head injury is most likely to happen to someone who is in a car wreck and is not wearing a seat belt. Other causes of major head injuries include bicycle or motorcycle accidents, sports injuries, and falls. HOW ARE HEAD INJURIES DIAGNOSED? A complete history of the event leading to the injury and your current symptoms will be helpful in diagnosing head injuries. Many times, pictures of the brain, such as  CT or MRI are needed to see the extent of the injury. Often, an overnight hospital stay is necessary for observation.  WHEN SHOULD I SEEK IMMEDIATE MEDICAL CARE?  You should get help right away if:  You have confusion or drowsiness.  You feel sick to your stomach (nauseous) or have continued, forceful vomiting.  You have dizziness or unsteadiness that is getting worse.  You have severe, continued headaches not relieved by medicine. Only take over-the-counter or  prescription medicines for pain, fever, or discomfort as directed by your health care provider.  You do not have normal function of the arms or legs or are unable to walk.  You notice changes in the black spots in the center of the colored part of your eye (pupil).  You have a clear or bloody fluid coming from your nose or ears.  You have a loss of vision. During the next 24 hours after the injury, you must stay with someone who can watch you for the warning signs. This person should contact local emergency services (911 in the U.[place] if you have seizures, you become unconscious, or you are unable to wake up. HOW CAN I PREVENT A HEAD INJURY IN THE FUTURE? The most important factor for preventing major head injuries is avoiding motor vehicle accidents. To minimize the potential for damage to your head, it is crucial to wear seat belts while riding in motor vehicles. Wearing helmets while bike riding and playing collision sports (like football) is also helpful. Also, avoiding dangerous activities around the house will further help reduce your risk of head injury.  WHEN CAN I RETURN TO NORMAL ACTIVITIES AND ATHLETICS? You should be reevaluated by your health care provider before returning to these activities. If you have any of the following symptoms, you should not return to activities or contact sports until 1 week after the symptoms have stopped:  Persistent headache.  Dizziness or vertigo.  Poor attention and concentration.  Confusion.  Memory problems.  Nausea or vomiting.  Fatigue or tire easily.  Irritability.  Intolerant of bright lights or loud noises.  Anxiety or depression.  Disturbed sleep. MAKE SURE YOU:   Understand these instructions.  Will watch your condition.  Will get help right away if you are not doing well or get worse. Document Released: 02/07/2005 Document Revised: 02/12/2013 Document Reviewed: 10/15/2012 [facility] Patient Information 2015 [place],  [place]. This information is not intended to replace advice given to you by your health care provider. Make sure you discuss any questions you have with your health care provider.  Laceration Care, Adult A laceration is a cut or lesion that goes through all layers of the skin and into the tissue just beneath the skin. TREATMENT  Some lacerations may not require closure. Some lacerations may not be able to be closed due to an increased risk of infection. It is important to see your caregiver as soon as possible after an injury to minimize the risk of infection and maximize the opportunity for successful closure. If closure is appropriate, pain medicines may be given, if needed. The wound will be cleaned to help prevent infection. Your caregiver will use stitches (sutures), staples, wound glue (adhesive), or skin adhesive strips to repair the laceration. These tools bring the skin edges together to allow for faster healing and a better cosmetic outcome. However, all wounds will heal with a scar. Once the wound has healed, scarring can be minimized by covering the wound with sunscreen during the day for 1 full  year. HOME CARE INSTRUCTIONS  For sutures or staples:  Keep the wound clean and dry.  If you were given a bandage (dressing), you should change it at least once a day. Also, change the dressing if it becomes wet or dirty, or as directed by your caregiver.  Wash the wound with soap and water 2 times a day. Rinse the wound off with water to remove all soap. Pat the wound dry with a clean towel.  After cleaning, apply a thin layer of the antibiotic ointment as recommended by your caregiver. This will help prevent infection and keep the dressing from sticking.  You may shower as usual after the first 24 hours. Do not soak the wound in water until the sutures are removed.  Only take over-the-counter or prescription medicines for pain, discomfort, or fever as directed by your caregiver.  Get your  sutures or staples removed as directed by your caregiver. For skin adhesive strips:  Keep the wound clean and dry.  Do not get the skin adhesive strips wet. You may bathe carefully, using caution to keep the wound dry.  If the wound gets wet, pat it dry with a clean towel.  Skin adhesive strips will fall off on their own. You may trim the strips as the wound heals. Do not remove skin adhesive strips that are still stuck to the wound. They will fall off in time. For wound adhesive:  You may briefly wet your wound in the shower or bath. Do not soak or scrub the wound. Do not swim. Avoid periods of heavy perspiration until the skin adhesive has fallen off on its own. After showering or bathing, gently pat the wound dry with a clean towel.  Do not apply liquid medicine, cream medicine, or ointment medicine to your wound while the skin adhesive is in place. This may loosen the film before your wound is healed.  If a dressing is placed over the wound, be careful not to apply tape directly over the skin adhesive. This may cause the adhesive to be pulled off before the wound is healed.  Avoid prolonged exposure to sunlight or tanning lamps while the skin adhesive is in place. Exposure to ultraviolet light in the first year will darken the scar.  The skin adhesive will usually remain in place for 5 to 10 days, then naturally fall off the skin. Do not pick at the adhesive film. You may need a tetanus shot if:  You cannot remember when you had your last tetanus shot.  You have never had a tetanus shot. If you get a tetanus shot, your arm may swell, get red, and feel warm to the touch. This is common and not a problem. If you need a tetanus shot and you choose not to have one, there is a rare chance of getting tetanus. Sickness from tetanus can be serious. SEEK MEDICAL CARE IF:   You have redness, swelling, or increasing pain in the wound.  You see a red line that goes away from the wound.  You  have yellowish-Ledger fluid (pus) coming from the wound.  You have a fever.  You notice a bad smell coming from the wound or dressing.  Your wound breaks open before or after sutures have been removed.  You notice something coming out of the wound such as wood or glass.  Your wound is on your hand or foot and you cannot move a finger or toe. SEEK IMMEDIATE MEDICAL CARE IF:   Your pain  is not controlled with prescribed medicine.  You have severe swelling around the wound causing pain and numbness or a change in color in your arm, hand, leg, or foot.  Your wound splits open and starts bleeding.  You have worsening numbness, weakness, or loss of function of any joint around or beyond the wound.  You develop painful lumps near the wound or on the skin anywhere on your body. MAKE SURE YOU:   Understand these instructions.  Will watch your condition.  Will get help right away if you are not doing well or get worse. Document Released: 02/07/2005 Document Revised: 05/02/2011 Document Reviewed: 08/03/2010 [facility] Patient Information 2015 [place], [place]. This information is not intended to replace advice given to you by your health care provider. Make sure you discuss any questions you have with your health care provider.   Emergency Department Resource Guide 1) Find a Doctor and Pay Out of Pocket Although you won't have to find out who is covered by your insurance plan, it is a good idea to ask around and get recommendations. You will then need to call the office and see if the doctor you have chosen will accept you as a new patient and what types of options they offer for patients who are self-pay. Some doctors offer discounts or will set up payment plans for their patients who do not have insurance, but you will need to ask so you aren't surprised when you get to your appointment.  2) Contact Your Local Health Department Not all health departments have doctors that can see patients  for sick visits, but many do, so it is worth a call to see if yours does. If you don't know where your local health department is, you can check in your phone book. The CDC also has a tool to help you locate your state's health department, and many state websites also have listings of all of their local health departments.  3) Find a Walk-in Clinic If your illness is not likely to be very severe or complicated, you may want to try a walk in clinic. These are popping up all over the country in pharmacies, drugstores, and shopping centers. They're usually staffed by nurse practitioners or physician assistants that have been trained to treat common illnesses and complaints. They're usually fairly quick and inexpensive. However, if you have serious medical issues or chronic medical problems, these are probably not your best option.  No Primary Care Doctor: - Call Health Connect at  [phone] - they can help you locate a primary care doctor that  accepts your insurance, provides certain services, etc. - Physician Referral Service- [phone]  Chronic Pain Problems: Organization         Address  Phone   Notes  [facility]  [phone] Patients need to be referred by their primary care doctor.   Medication Assistance: Organization         Address  Phone   Notes  [facility] Medication [facility] [address] [place], [place] [address] [phone] --Must be a resident of [facility] -- Must have NO insurance coverage whatsoever (no Medicaid/ Medicare, etc.) -- The pt. MUST have a primary care doctor that directs their care regularly and follows them in the community   [facility]ist  [phone]   [employer]  [phone]    Agencies that provide inexpensive medical care: Retail buyer  Notes  [facility]  [phone]   [facility]    [phone]   [facility] [address] [place], [place] [address] [phone]   [facility] [address], [place] [phone]   Planned Parenthood    [phone]   [facility]    [phone]   [facility]  [address], [place] Phone:  [phone], Fax:  [phone] Hours of Operation:  9 am - 6 pm, M-F.  Also accepts Medicaid/Medicare and self-pay.  [facility]  [address], [place] Phone: [phone], Fax: [phone]. Hours of Operation:  8:30 am - 5:30 pm, M-F.  Also accepts Medicaid and self-pay.  [facility] [place] [address] [place] Phone: [phone]   [facility] [address] Natasha Bence [place], [place] [phone], Ext. [phone] Mondays & Thursdays: 7-9 AM.  First 15 patients are seen on a first come, first serve basis.    Medicaid-accepting [facility] Providers:  Organization         Address  Phone   Notes  [facility] [address], [place] [phone] Also accepts self-pay patients.  [facility] [address] Laurell Josephs [place], [place]  [phone]   [facility] [address], [place] [phone]   [facility] [address], [place] [phone]   Renaye Rakers [address], [place]   [phone] Only accepts [place] Access [place] patients after they have their name applied to their card.   Self-Pay (no insurance) in [facility]:  Organization         Address  Phone   Notes  Sickle Cell Patients, [facility] [address] [place], [place] [phone]   [facility] [address] [place], [place] [phone]   [facility] [place]  [address], [place] [phone]   [facility]/Dr. Osei-Bonsu  [address], [place] or [address], [place] [phone] Phone number for both [place] and [place] locations is the same.  [facility]gent [facility] and [facility] [address], [place] [phone]   [facility] [address], [place] or [address] [phone]   [facility] [address], [place] [phone], phone; [phone], fax Sees patients 1st and 3rd Saturday of every month.  Must not qualify for public or private insurance (i.e. Medicaid, Medicare, [place] Health Choice, Veterans' Benefits)  Household income should be no more than 200% of the poverty level The clinic cannot treat you if you are pregnant or think you are pregnant  Sexually transmitted diseases are not treated at the clinic.    Dental Care: Organization         Address  Phone  Notes  [facility] Department of [facility] [address] [place], [place] [phone] Accepts children up to age 75 who are enrolled in [place] or [place] Health Choice; pregnant women with a Medicaid card; and children who have applied for Medicaid or  Health Choice, but were declined, whose parents can pay a reduced fee at time of service.  [facility]  Department of [facility]  [address], [place] [phone] Accepts children up to age 53 who are enrolled in [place] or [place] Health Choice; pregnant women with a Medicaid card; and children who have applied for Medicaid or [place] Health Choice, but were declined, whose parents can pay a reduced fee at time of service.  [facility]  [address] [place], [place] [phone] Patients are seen by appointment only. Walk-ins are not accepted. [facility] will see patients 52 years of age and older. Monday - Tuesday (8am-5pm) Most Wednesdays (8:30-5pm) $30 per visit, cash only  [facility]  [address], [facility] [phone] Patients are seen by appointment only. Walk-ins are not accepted. [facility] will see patients 76 years of age and older. One Wednesday Evening (Monthly: Volunteer Based).  $30 per visit, cash only  [employer]  [phone] for adults; Children under age 67, call Graduate Pediatric Dentistry at [phone]. Children aged 63-14, please call [phone] to request a pediatric application.  Dental services are provided in all areas of dental care including fillings, crowns and bridges, complete and partial dentures, implants, gum treatment, root canals, and extractions. Preventive care is also provided. Treatment is provided to both adults and children. Patients are selected via a lottery and there is often a waiting list.   [facility] [address] [place]  [phone] [email or web address]   [facility] [address] [place], [place] [phone], Ext. [phone] Second and Fourth Thursday of each month, opens at 6:30 AM; Clinic ends at 9 AM.  Patients are seen on a first-come first-served basis, and a limited number are seen during each clinic.   [facility]  [address] Ether Griffins [place], [place] [phone]   Eligibility Requirements You must have lived in [place], [place], or [place] counties for at least the last three months.   You cannot be eligible for state or federal sponsored [place], including [employer], [place], or [employer].   You generally cannot be eligible for healthcare insurance through your employer.    How to apply: Eligibility screenings are held every Tuesday and Wednesday afternoon from 1:00 pm until 4:00 pm. You do not need an appointment for the interview!  [facility] [address] [place], [place] [phone]   [facility] Department  [phone]   [facility]  [phone]    [facility] Health Department  [phone]    Behavioral Health Resources in the Community: Intensive Outpatient Programs Organization         Address  Phone  Notes  [facility] [address] [place], [place] [phone]   [facility] Outpatient [address] [place], [place] [phone]   [facility] [address] [place], [place]  [phone]   [facility] [address]  [place], [place] [phone] or [phone]   Substance Abuse Resources Organization         Address  Phone  Notes  [facility]  [phone]   [facility]  [phone]   [facility] [place]  [phone]   Floydene Flock  [phone]   Residential & Outpatient Substance Abuse Program  [phone]   Psychological Services Organization         Address  Phone  Notes  [facility]  [phone]   [facility] Services  [phone]   [facility] [address], [place] [phone] or [phone]    Mobile Crisis Teams Organization         Address  Phone  Notes  [facility], Mobile Crisis Care Unit  [phone]   [facility]  [address] [place], [place] [phone]   Doristine Locks [address] [place] [phone]    Self-Help/Support Groups Organization         Address  Phone             Notes  [facility] [place] - variety of support groups  336- I7437963 Call for more information  Narcotics Anonymous (NA), [facility] [address], [employer] [place]  2 meetings at this location   Statistician         Address  Phone  Notes  [facility] [address],    [place]  [phone]   [facility]  [address] [place] [address], [place], [place] [phone]   [facility] [address] [place], [place]  [phone] Admissions: 8am-3pm M-F  [facility] [address],    [place], [place] [phone]   [facility] [address] [place], [place], [place] [phone]   [facility] [address]  [place], [place] [phone]   [facility] - Intensive Outpatient [address] Laurell Joseph[address], [place], [place] [phone]   [facility] (Addiction [facility] [address] [place]  [place], [place] [phone] or [phone]   [facility] ([facility]) [address], [place], [place] [phone] Accepts Medicaid  [facility] [address],  [place] [phone] Substance Abuse/Addiction Treatment   [facility] Main Organization         Address  Phone  Notes  [facility]  [phone]   Angie Fava, PhD [address] Ervin Knack [place], [place]   [phone] or [phone]   [facility] Behavioral   [address] [place], [place] [phone]   [facility] [address], [place], [place] [phone] Insurance/Medicaid/sponsorship through [facility] and [facility] [address]                                    [place], [place] [phone] Therapy/tele-psych/case  [facility] [address], [place] [phone]    Dr. Lolly Mustache  [phone]   [facility]  [employer] [facility] Dept. 1) [address], [place] 2) [address] [place] 3)  [address], [place] [phone]  [phone]   [facility] Child Abuse Hotline (212) 496-1137 or [phone] (After Hours)

## 2014-08-20 ENCOUNTER — Encounter (HOSPITAL_COMMUNITY): Payer: Self-pay | Admitting: Family Medicine

## 2014-08-20 ENCOUNTER — Emergency Department (HOSPITAL_COMMUNITY)
Admission: EM | Admit: 2014-08-20 | Discharge: 2014-08-20 | Disposition: A | Discharge status: Home / Self Care | Payer: Self-pay | Attending: Emergency Medicine | Admitting: Emergency Medicine

## 2014-08-20 DIAGNOSIS — Z720 Tobacco use: Secondary | ICD-10-CM | POA: Insufficient documentation

## 2014-08-20 DIAGNOSIS — Z4802 Encounter for removal of sutures: Secondary | ICD-10-CM | POA: Insufficient documentation

## 2014-08-20 NOTE — ED Notes (Signed)
Pt here for suture removal
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
## 2014-08-20 NOTE — ED Notes (Signed)
Sutures in hsi lt forehead are well healed no redness

## 2014-08-20 NOTE — Discharge Instructions (Signed)

## 2015-08-30 ENCOUNTER — Encounter (HOSPITAL_COMMUNITY): Payer: Self-pay | Admitting: Emergency Medicine

## 2015-08-30 ENCOUNTER — Emergency Department (HOSPITAL_COMMUNITY)
Admission: EM | Admit: 2015-08-30 | Discharge: 2015-08-30 | Disposition: A | Discharge status: Home / Self Care | Payer: Self-pay | Attending: Emergency Medicine | Admitting: Emergency Medicine

## 2015-08-30 DIAGNOSIS — F17210 Nicotine dependence, cigarettes, uncomplicated: Secondary | ICD-10-CM | POA: Insufficient documentation

## 2015-08-30 DIAGNOSIS — T783XXA Angioneurotic edema, initial encounter: Secondary | ICD-10-CM | POA: Insufficient documentation

## 2015-08-30 LAB — CBC WITH DIFFERENTIAL/PLATELET
Basophils Absolute: 0 10*3/uL (ref 0.0–0.1)
Basophils Relative: 0 %
Eosinophils Absolute: 0.1 10*3/uL (ref 0.0–0.7)
Eosinophils Relative: 2 %
HCT: 35.8 % — ABNORMAL LOW (ref 39.0–52.0)
Hemoglobin: 11.9 g/dL — ABNORMAL LOW (ref 13.0–17.0)
LYMPHS ABS: 1.6 10*3/uL (ref 0.7–4.0)
LYMPHS PCT: 27 %
MCH: 24.1 pg — AB (ref 26.0–34.0)
MCHC: 33.2 g/dL (ref 30.0–36.0)
MCV: 72.6 fL — AB (ref 78.0–100.0)
MONO ABS: 0.5 10*3/uL (ref 0.1–1.0)
Monocytes Relative: 9 %
Neutro Abs: 3.7 10*3/uL (ref 1.7–7.7)
Neutrophils Relative %: 62 %
Platelets: 216 10*3/uL (ref 150–400)
RBC: 4.93 MIL/uL (ref 4.22–5.81)
RDW: 15.7 % — AB (ref 11.5–15.5)
WBC: 6 10*3/uL (ref 4.0–10.5)

## 2015-08-30 LAB — I-STAT CHEM 8, ED
BUN: 9 mg/dL (ref 6–20)
CREATININE: 0.8 mg/dL (ref 0.61–1.24)
Calcium, Ion: 1.22 mmol/L (ref 1.13–1.30)
Chloride: 101 mmol/L (ref 101–111)
GLUCOSE: 85 mg/dL (ref 65–99)
HCT: 41 % (ref 39.0–52.0)
Hemoglobin: 13.9 g/dL (ref 13.0–17.0)
POTASSIUM: 4.2 mmol/L (ref 3.5–5.1)
Sodium: 139 mmol/L (ref 135–145)
TCO2: 27 mmol/L (ref 0–100)

## 2015-08-30 MED ORDER — PREDNISONE 20 MG PO TABS: 60.0000 mg | ORAL_TABLET | Freq: Every day | ORAL | Status: DC

## 2015-08-30 MED ORDER — DIPHENHYDRAMINE HCL 25 MG PO TABS: 25.0000 mg | ORAL_TABLET | Freq: Four times a day (QID) | ORAL | Status: DC | PRN

## 2015-08-30 MED ORDER — METHYLPREDNISOLONE SODIUM SUCC 125 MG IJ SOLR
125.0000 mg | Freq: Once | INTRAMUSCULAR | Status: AC
  Administered 2015-08-30: 125 mg via INTRAVENOUS
  Filled 2015-08-30: qty 2

## 2015-08-30 MED ORDER — FAMOTIDINE 20 MG PO TABS: 20.0000 mg | ORAL_TABLET | Freq: Two times a day (BID) | ORAL | Status: DC

## 2015-08-30 MED ORDER — FAMOTIDINE IN NACL 20-0.9 MG/50ML-% IV SOLN
20.0000 mg | Freq: Once | INTRAVENOUS | Status: AC
  Administered 2015-08-30: 20 mg via INTRAVENOUS
  Filled 2015-08-30: qty 50

## 2015-08-30 NOTE — Discharge Instructions (Signed)
Angioedema  Angioedema is a sudden swelling of tissues, often of the skin. It can occur on the face or genitals or in the abdomen or other body parts. The swelling usually develops over a short period and gets better in 24 to 48 hours. It often begins during the night and is found when the person wakes up. The person may also get red, itchy patches of skin (hives). Angioedema can be dangerous if it involves swelling of the air passages.   Depending on the cause, episodes of angioedema may only happen once, come back in unpredictable patterns, or repeat for several years and then gradually fade away.   CAUSES   Angioedema can be caused by an allergic reaction to various triggers. It can also result from nonallergic causes, including reactions to drugs, immune system disorders, viral infections, or an abnormal gene that is passed to you from your parents (hereditary). For some people with angioedema, the cause is unknown.   Some things that can trigger angioedema include:    Foods.    Medicines, such as ACE inhibitors, ARBs, nonsteroidal anti-inflammatory agents, or estrogen.    Latex.    Animal saliva.    Insect stings.    Dyes used in X-rays.    Mild injury.    Dental work.   Surgery.   Stress.    Sudden changes in temperature.    Exercise.  SIGNS AND SYMPTOMS    Swelling of the skin.   Hives. If these are present, there is also intense itching.   Redness in the affected area.    Pain in the affected area.   Swollen lips or tongue.   Breathing problems. This may happen if the air passages swell.   Wheezing.  If internal organs are involved, there may be:    Nausea.    Abdominal pain.    Vomiting.    Difficulty swallowing.    Difficulty passing urine.  DIAGNOSIS    Your health care provider will examine the affected area and take a medical and family history.   Various tests may be done to help determine the cause. Tests may include:   Allergy skin tests to see if the problem  is an allergic reaction.    Blood tests to check for hereditary angioedema.    Tests to check for underlying diseases that could cause the condition.    A review of your medicines, including over-the-counter medicines, may be done.  TREATMENT   Treatment will depend on the cause of the angioedema. Possible treatments include:    Removal of anything that triggered the condition (such as stopping certain medicines).    Medicines to treat symptoms or prevent attacks. Medicines given may include:     Antihistamines.     Epinephrine injection.     Steroids.    Hospitalization may be required for severe attacks. If the air passages are affected, it can be an emergency. Tubes may need to be placed to keep the airway open.  HOME CARE INSTRUCTIONS    Take all medicines as directed by your health care provider.   If you were given medicines for emergency allergy treatment, always carry them with you.   Wear a medical bracelet as directed by your health care provider.    Avoid known triggers.  SEEK MEDICAL CARE IF:    You have repeat attacks of angioedema.    Your attacks are more frequent or more severe despite preventive measures.    You have hereditary angioedema   and are considering having children. It is important to discuss with your health care provider the risks of passing the condition on to your children.  SEEK IMMEDIATE MEDICAL CARE IF:    You have severe swelling of the mouth, tongue, or lips.   You have difficulty breathing.    You have difficulty swallowing.    You faint.  MAKE SURE YOU:   Understand these instructions.   Will watch your condition.   Will get help right away if you are not doing well or get worse.     This information is not intended to replace advice given to you by your health care provider. Make sure you discuss any questions you have with your health care provider.     Document Released: 04/18/2001 Document Revised: 02/28/2014 Document Reviewed:  10/01/2012  Elsevier Interactive Patient Education 2016 [employer]

## 2015-08-30 NOTE — ED Provider Notes (Signed)
CSN: [id number]     Arrival date & time 08/30/15  1522 History   First MD Initiated Contact with Patient 08/30/15 1531     Chief Complaint  Patient presents with  . Angioedema      The history is provided by the patient.  Patient presents with swelling of his lips. Woke with this morning. States he's had 50 of Benadryl without relief. States his been gradually worsening during the day. Initially started on the right side of his upper lip and also both lobes. No difficulty swallowing. She has not had episodes like this before. No new medication. He is not on lisinopril. No family history of events like this. No nausea vomiting or diarrhea. No difficulty breathing. No difficult swallowing.  Past Medical History  Diagnosis Date  . Syncope 11/13/2013   Past Surgical History  Procedure Laterality Date  . Hernia repair     No family history on file. Social History  Substance Use Topics  . Smoking status: Current Every Day Smoker -- 0.50 packs/day    Types: Cigarettes  . Smokeless tobacco: Never Used  . Alcohol Use: No    Review of Systems  Constitutional: Negative for activity change and appetite change.  HENT: Positive for facial swelling. Negative for hearing loss, nosebleeds, rhinorrhea, tinnitus and voice change.   Eyes: Negative for pain.  Respiratory: Negative for chest tightness and shortness of breath.   Cardiovascular: Negative for chest pain and leg swelling.  Gastrointestinal: Negative for nausea, vomiting, abdominal pain and diarrhea.  Genitourinary: Negative for flank pain.  Musculoskeletal: Negative for back pain and neck stiffness.  Skin: Negative for rash.  Neurological: Negative for weakness, numbness and headaches.  Psychiatric/Behavioral: Negative for behavioral problems.      Allergies  Review of patient's allergies indicates no known allergies.  Home Medications   Prior to Admission medications   Medication Sig Start Date End Date Taking? Authorizing  Provider  diphenhydrAMINE (BENADRYL) 25 MG tablet Take 1 tablet (25 mg total) by mouth every 6 (six) hours as needed for allergies. 08/30/15   Benjiman Core, MD  famotidine (PEPCID) 20 MG tablet Take 1 tablet (20 mg total) by mouth 2 (two) times daily. 08/30/15   Benjiman Core, MD  HYDROcodone-acetaminophen (NORCO/VICODIN) 5-325 MG per tablet Take 1 tablet by mouth every 4 (four) hours as needed for moderate pain. Patient not taking: Reported on 08/30/2015 11/15/13   Dorothea Ogle, MD  ibuprofen (ADVIL,MOTRIN) 600 MG tablet Take 1 tablet (600 mg total) by mouth every 6 (six) hours as needed. Patient not taking: Reported on 08/30/2015 08/06/14   Ladona Mow, PA-C  predniSONE (DELTASONE) 20 MG tablet Take 3 tablets (60 mg total) by mouth daily. 08/30/15   Benjiman Core, MD   BP 98/63 mmHg  Pulse 55  Temp(Src) 98.2 F (36.8 C) (Oral)  Resp 13  Ht  (1.93 m)  Wt 150 lb (68.04 kg)  BMI 18.27 kg/m2  SpO2 100% Physical Exam  Constitutional: He appears well-developed.  HENT:  Head: Atraumatic.  Moderate angioedema of the upper and lower lips. No tongue swelling. No swelling of floor of mouth. No posterior pharyngeal swelling.  Eyes: EOM are normal.  Neck: Neck supple.  Cardiovascular: Normal rate.   Pulmonary/Chest: Effort normal.  Abdominal: Soft.  Musculoskeletal: Normal range of motion.  Neurological: He is alert.  Skin: Skin is warm. No erythema.    ED Course  Procedures (including critical care time) Labs Review Labs Reviewed  CBC WITH DIFFERENTIAL/PLATELET -  Abnormal; Notable for the following:    Hemoglobin 11.9 (*)    HCT 35.8 (*)    MCV 72.6 (*)    MCH 24.1 (*)    RDW 15.7 (*)    All other components within normal limits  I-STAT CHEM 8, ED    Imaging Review No results found. I have personally reviewed and evaluated these images and lab results as part of my medical decision-making.   EKG Interpretation None      MDM   Final diagnoses:  Angioedema, initial  encounter    Patient with angioedema of lips. Has been stable during 3 hours in the ER. Doubt it'll be an acute clinical presentation if it worsens. Feels fine. Will discharge home with steroids Benadryl and Pepcid. Will follow-up with an allergist.    Benjiman CoreNathan Rashid Whitenight, MD 08/30/15 [phone]

## 2015-08-30 NOTE — ED Notes (Signed)
Pt woke up around 0900-0930 this morning and had swelling of the lips.  He took two benadryl and laid back down around 1000.  Pt woke back up around 1500 and noticed the swelling had gotten worse.  Denies any trouble breathing at this time.  Denies any past history of allergic reactions. Is not aware of what may have stimulated it.

## 2015-08-30 NOTE — ED Notes (Signed)
He remains in no distress.  His lip/mouth area swelling remains pronounced, but is in no wise worse; and is perhaps a bit better than when he arrived in hospital.  We get for him a sandwich and a drink per his request which he enjoys and with which he has no difficulty in eating/drinking.

## 2015-08-30 NOTE — ED Notes (Addendum)
He continues to breath normally with his skin being normal, warm and dry.  Mouth/lips area edema unchanged.

## 2016-03-01 IMAGING — CR DG CHEST 1V PORT
2 series · 2 of 2 positions shown · non-contrast
Comparison: 05/06/2006.

CLINICAL DATA: Code STEMI.

EXAM:
PORTABLE CHEST - 1 VIEW

[AP (1 of 2)]
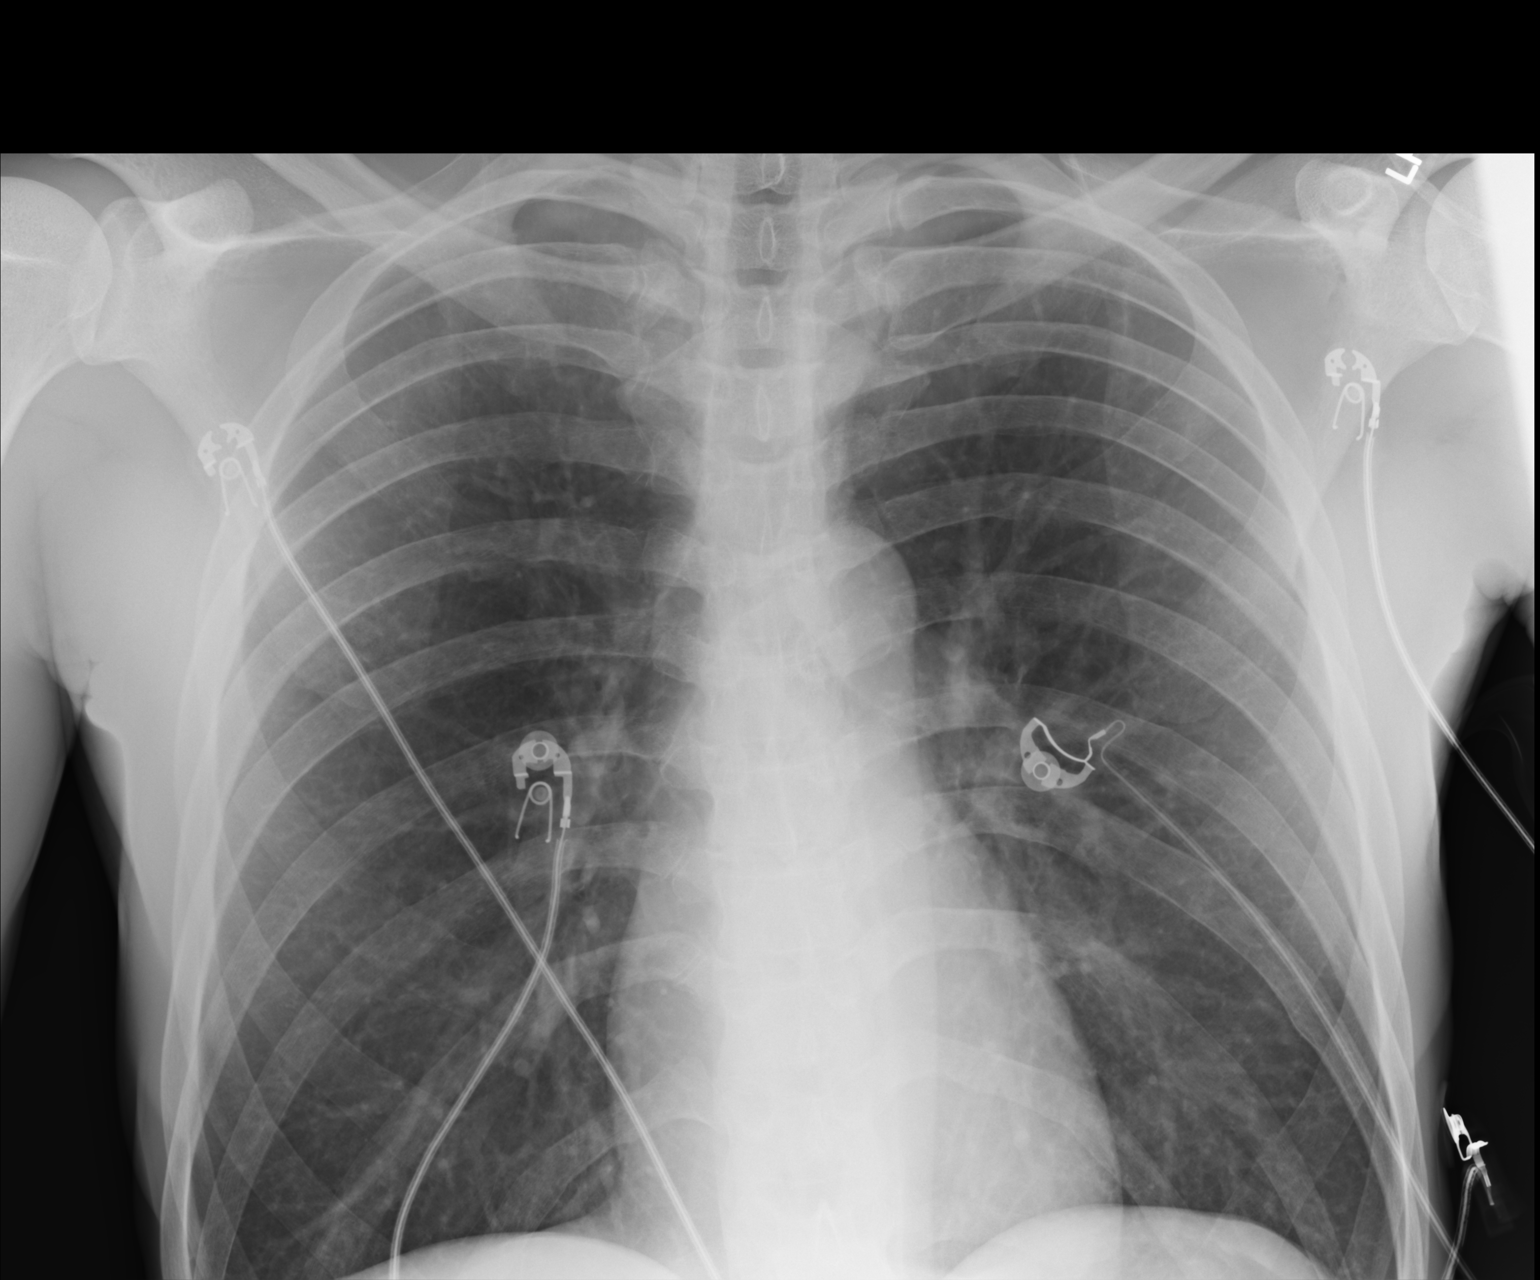

[AP (2 of 2)]
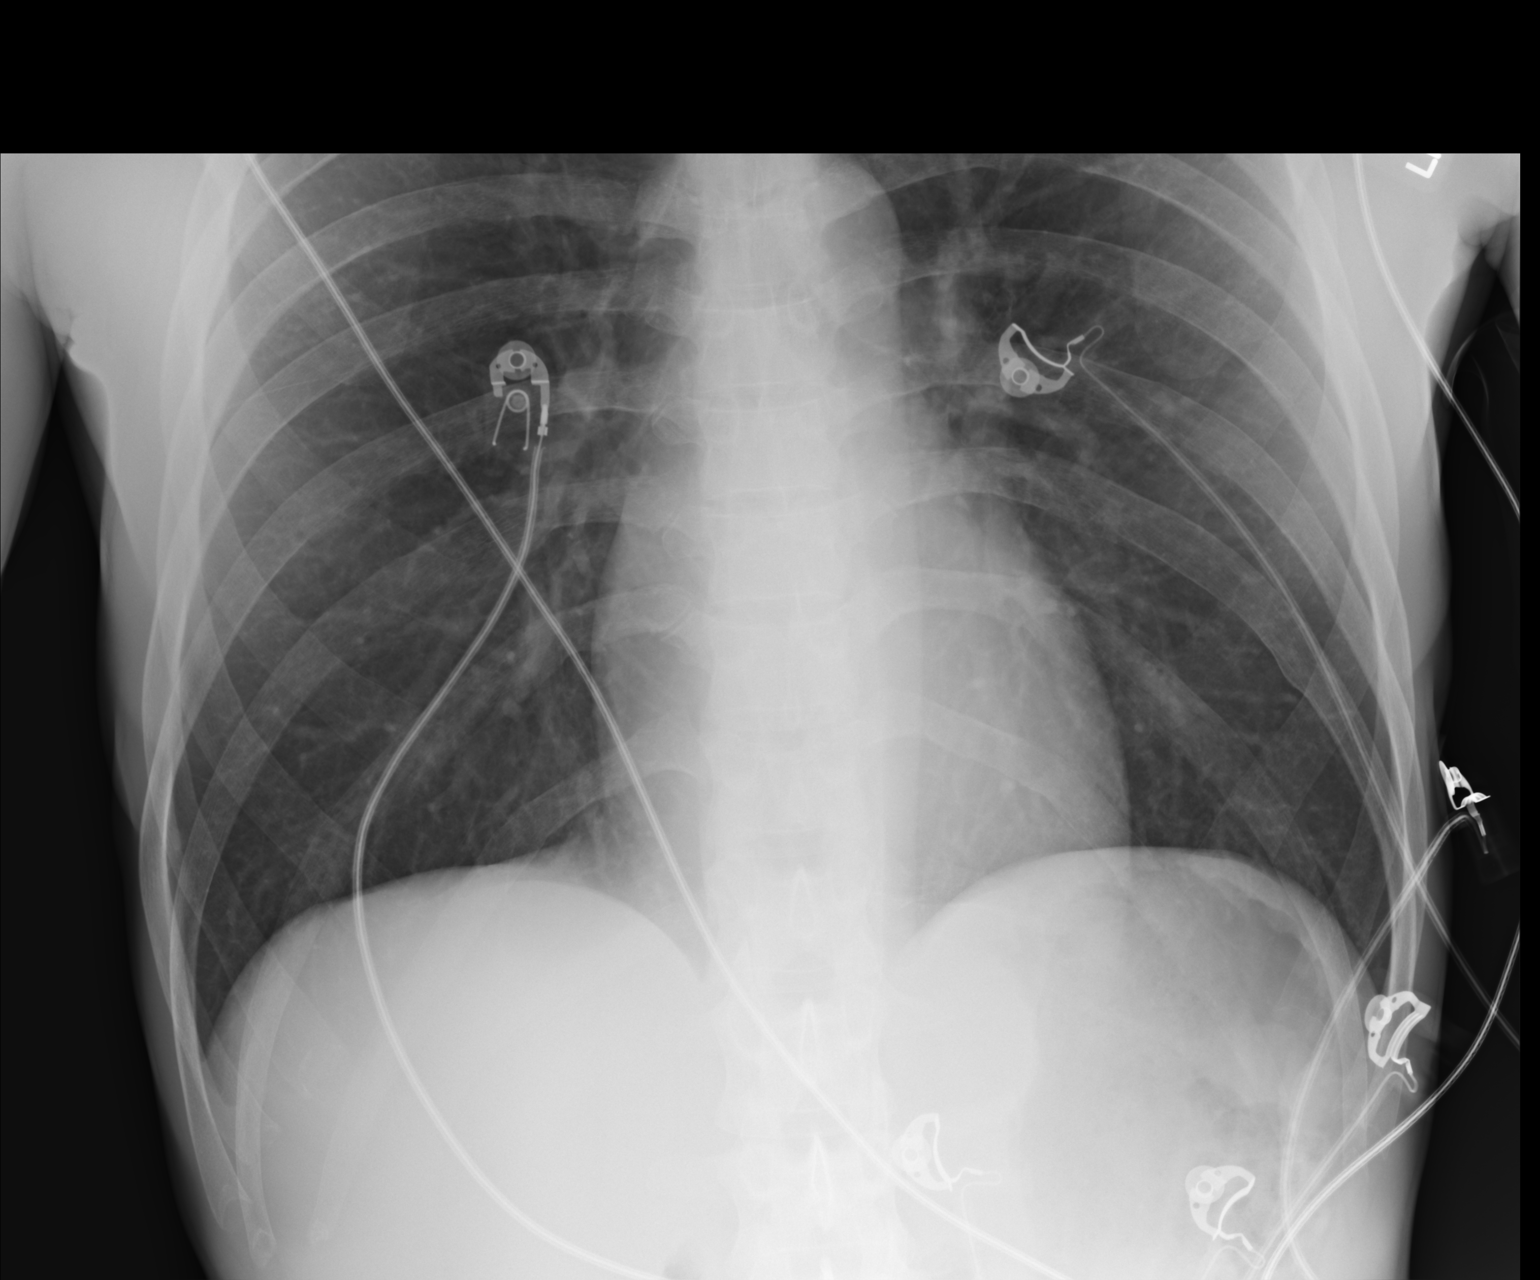

[2 of 2 positions shown; findings below may reference images not displayed]

FINDINGS: Normal heart size and mediastinal contours. No acute infiltrate or
edema. No effusion or pneumothorax. No acute osseous findings.
IMPRESSION: Negative chest.

## 2022-01-21 DIAGNOSIS — Z419 Encounter for procedure for purposes other than remedying health state, unspecified: Secondary | ICD-10-CM | POA: Diagnosis not present

## 2022-02-21 DIAGNOSIS — Z419 Encounter for procedure for purposes other than remedying health state, unspecified: Secondary | ICD-10-CM | POA: Diagnosis not present

## 2022-02-24 ENCOUNTER — Telehealth: Payer: Self-pay

## 2022-02-24 NOTE — Telephone Encounter (Signed)
Mychart msg sent. AS, CMA 

## 2022-03-24 DIAGNOSIS — Z419 Encounter for procedure for purposes other than remedying health state, unspecified: Secondary | ICD-10-CM | POA: Diagnosis not present

## 2022-04-22 DIAGNOSIS — Z419 Encounter for procedure for purposes other than remedying health state, unspecified: Secondary | ICD-10-CM | POA: Diagnosis not present

## 2022-05-23 DIAGNOSIS — Z419 Encounter for procedure for purposes other than remedying health state, unspecified: Secondary | ICD-10-CM | POA: Diagnosis not present

## 2022-08-10 ENCOUNTER — Inpatient Hospital Stay (HOSPITAL_COMMUNITY)
Admission: EM | Admit: 2022-08-10 | Discharge: 2022-08-12 | DRG: 915 | Disposition: A | Discharge status: Home / Self Care | Payer: Commercial Managed Care - HMO | Attending: Internal Medicine | Admitting: Internal Medicine

## 2022-08-10 ENCOUNTER — Observation Stay (HOSPITAL_COMMUNITY): Payer: Commercial Managed Care - HMO

## 2022-08-10 ENCOUNTER — Encounter (HOSPITAL_COMMUNITY): Payer: Self-pay

## 2022-08-10 ENCOUNTER — Other Ambulatory Visit: Payer: Self-pay

## 2022-08-10 DIAGNOSIS — M542 Cervicalgia: Secondary | ICD-10-CM | POA: Diagnosis present

## 2022-08-10 DIAGNOSIS — F17210 Nicotine dependence, cigarettes, uncomplicated: Secondary | ICD-10-CM | POA: Diagnosis present

## 2022-08-10 DIAGNOSIS — Z7151 Drug abuse counseling and surveillance of drug abuser: Secondary | ICD-10-CM

## 2022-08-10 DIAGNOSIS — J9600 Acute respiratory failure, unspecified whether with hypoxia or hypercapnia: Secondary | ICD-10-CM

## 2022-08-10 DIAGNOSIS — D509 Iron deficiency anemia, unspecified: Secondary | ICD-10-CM | POA: Diagnosis present

## 2022-08-10 DIAGNOSIS — E0590 Thyrotoxicosis, unspecified without thyrotoxic crisis or storm: Secondary | ICD-10-CM | POA: Diagnosis present

## 2022-08-10 DIAGNOSIS — T783XXA Angioneurotic edema, initial encounter: Principal | ICD-10-CM | POA: Diagnosis present

## 2022-08-10 DIAGNOSIS — E878 Other disorders of electrolyte and fluid balance, not elsewhere classified: Secondary | ICD-10-CM | POA: Diagnosis present

## 2022-08-10 DIAGNOSIS — R499 Unspecified voice and resonance disorder: Secondary | ICD-10-CM | POA: Diagnosis present

## 2022-08-10 DIAGNOSIS — F1410 Cocaine abuse, uncomplicated: Secondary | ICD-10-CM | POA: Diagnosis present

## 2022-08-10 DIAGNOSIS — J9382 Other air leak: Secondary | ICD-10-CM | POA: Diagnosis not present

## 2022-08-10 DIAGNOSIS — Z87898 Personal history of other specified conditions: Secondary | ICD-10-CM | POA: Diagnosis present

## 2022-08-10 DIAGNOSIS — X58XXXA Exposure to other specified factors, initial encounter: Secondary | ICD-10-CM | POA: Diagnosis present

## 2022-08-10 DIAGNOSIS — R600 Localized edema: Secondary | ICD-10-CM | POA: Diagnosis not present

## 2022-08-10 DIAGNOSIS — F1290 Cannabis use, unspecified, uncomplicated: Secondary | ICD-10-CM | POA: Diagnosis present

## 2022-08-10 DIAGNOSIS — E871 Hypo-osmolality and hyponatremia: Secondary | ICD-10-CM | POA: Diagnosis present

## 2022-08-10 LAB — GLUCOSE, CAPILLARY
Glucose-Capillary: 162 mg/dL — ABNORMAL HIGH (ref 70–99)
Glucose-Capillary: 154 mg/dL — ABNORMAL HIGH (ref 70–99)
Glucose-Capillary: 134 mg/dL — ABNORMAL HIGH (ref 70–99)

## 2022-08-10 LAB — COMPREHENSIVE METABOLIC PANEL
ALT: 70 U/L — ABNORMAL HIGH (ref 0–44)
AST: 103 U/L — ABNORMAL HIGH (ref 15–41)
Albumin: 4.6 g/dL (ref 3.5–5.0)
Alkaline Phosphatase: 79 U/L (ref 38–126)
Anion gap: 12 (ref 5–15)
BUN: 11 mg/dL (ref 6–20)
CO2: 23 mmol/L (ref 22–32)
Calcium: 9.5 mg/dL (ref 8.9–10.3)
Chloride: 96 mmol/L — ABNORMAL LOW (ref 98–111)
Creatinine, Ser: 0.99 mg/dL (ref 0.61–1.24)
GFR, Estimated: >60 mL/min (ref >60)
Glucose, Bld: 94 mg/dL (ref 70–99)
Potassium: 3.7 mmol/L (ref 3.5–5.1)
Sodium: 131 mmol/L — ABNORMAL LOW (ref 135–145)
Total Bilirubin: 1.1 mg/dL (ref 0.3–1.2)
Total Protein: 8.5 g/dL — ABNORMAL HIGH (ref 6.5–8.1)

## 2022-08-10 LAB — POCT I-STAT 7, (LYTES, BLD GAS, ICA,H+H)
Acid-base deficit: 1 mmol/L (ref 0.0–2.0)
Bicarbonate: 20.7 mmol/L (ref 20.0–28.0)
Calcium, Ion: 1.18 mmol/L (ref 1.15–1.40)
HCT: 44 % (ref 39.0–52.0)
Hemoglobin: 15 g/dL (ref 13.0–17.0)
O2 Saturation: 100 %
Potassium: 4.3 mmol/L (ref 3.5–5.1)
Sodium: 130 mmol/L — ABNORMAL LOW (ref 135–145)
TCO2: 21 mmol/L — ABNORMAL LOW (ref 22–32)
pCO2 arterial: 27.7 mmHg — ABNORMAL LOW (ref 32–48)
pH, Arterial: 7.48 — ABNORMAL HIGH (ref 7.35–7.45)
pO2, Arterial: 269 mmHg — ABNORMAL HIGH (ref 83–108)

## 2022-08-10 LAB — CBC WITH DIFFERENTIAL/PLATELET
Abs Immature Granulocytes: 0.02 10*3/uL (ref 0.00–0.07)
Basophils Absolute: 0 10*3/uL (ref 0.0–0.1)
Basophils Relative: 0 %
Eosinophils Absolute: 0.1 10*3/uL (ref 0.0–0.5)
Eosinophils Relative: 1 %
HCT: 43.7 % (ref 39.0–52.0)
Hemoglobin: 14.4 g/dL (ref 13.0–17.0)
Immature Granulocytes: 0 %
Lymphocytes Relative: 30 %
Lymphs Abs: 1.9 10*3/uL (ref 0.7–4.0)
MCH: 25.3 pg — ABNORMAL LOW (ref 26.0–34.0)
MCHC: 33 g/dL (ref 30.0–36.0)
MCV: 76.7 fL — ABNORMAL LOW (ref 80.0–100.0)
Monocytes Absolute: 1 10*3/uL (ref 0.1–1.0)
Monocytes Relative: 16 %
Neutro Abs: 3.3 10*3/uL (ref 1.7–7.7)
Neutrophils Relative %: 53 %
Platelets: 261 10*3/uL (ref 150–400)
RBC: 5.7 MIL/uL (ref 4.22–5.81)
RDW: 16 % — ABNORMAL HIGH (ref 11.5–15.5)
WBC: 6.3 10*3/uL (ref 4.0–10.5)
nRBC: 0 % (ref 0.0–0.2)

## 2022-08-10 LAB — C-REACTIVE PROTEIN: CRP: <0.5 mg/dL (ref <1.0)

## 2022-08-10 LAB — SEDIMENTATION RATE: Sed Rate: 1 mm/hr (ref 0–16)

## 2022-08-10 LAB — TSH: TSH: 0.103 u[IU]/mL — ABNORMAL LOW (ref 0.350–4.500)

## 2022-08-10 LAB — HIV ANTIBODY (ROUTINE TESTING W REFLEX): HIV Screen 4th Generation wRfx: NONREACTIVE

## 2022-08-10 MED ORDER — PROPOFOL 1000 MG/100ML IV EMUL
0.0000 ug/kg/min | INTRAVENOUS | Status: DC
Start: 2022-08-10 — End: 2022-08-10

## 2022-08-10 MED ORDER — DEXAMETHASONE SODIUM PHOSPHATE 10 MG/ML IJ SOLN
12.0000 mg | Freq: Once | INTRAMUSCULAR | Status: AC
  Administered 2022-08-10: 12 mg via INTRAVENOUS

## 2022-08-10 MED ORDER — FAMOTIDINE IN NACL 20-0.9 MG/50ML-% IV SOLN
20.0000 mg | Freq: Once | INTRAVENOUS | Status: AC
  Administered 2022-08-10: 20 mg via INTRAVENOUS
  Filled 2022-08-10: qty 50

## 2022-08-10 MED ORDER — DIPHENHYDRAMINE HCL 50 MG/ML IJ SOLN: 25.0000 mg | Freq: Once | INTRAMUSCULAR | Status: AC

## 2022-08-10 MED ORDER — FAMOTIDINE 20 MG PO TABS: 20.0000 mg | ORAL_TABLET | Freq: Every day | ORAL | Status: DC

## 2022-08-10 MED ORDER — ETOMIDATE 2 MG/ML IV SOLN
INTRAVENOUS | Status: AC
  Administered 2022-08-10: 20 mg via INTRAVENOUS
  Filled 2022-08-10: qty 20

## 2022-08-10 MED ORDER — LORATADINE 10 MG PO TABS: 10.0000 mg | ORAL_TABLET | Freq: Every day | ORAL | Status: DC

## 2022-08-10 MED ORDER — ETOMIDATE 2 MG/ML IV SOLN: 20.0000 mg | Freq: Once | INTRAVENOUS | Status: AC

## 2022-08-10 MED ORDER — ENOXAPARIN SODIUM 40 MG/0.4ML IJ SOSY
40.0000 mg | PREFILLED_SYRINGE | INTRAMUSCULAR | Status: DC
  Administered 2022-08-11: 40 mg via SUBCUTANEOUS
  Filled 2022-08-10: qty 0.4

## 2022-08-10 MED ORDER — DEXAMETHASONE SODIUM PHOSPHATE 10 MG/ML IJ SOLN
INTRAMUSCULAR | Status: AC
  Filled 2022-08-10: qty 1

## 2022-08-10 MED ORDER — DIPHENHYDRAMINE HCL 50 MG/ML IJ SOLN
25.0000 mg | Freq: Once | INTRAMUSCULAR | Status: AC
  Administered 2022-08-10: 25 mg via INTRAVENOUS
  Filled 2022-08-10: qty 1

## 2022-08-10 MED ORDER — MIDAZOLAM HCL 2 MG/2ML IJ SOLN: 1.0000 mg | INTRAMUSCULAR | Status: DC | PRN

## 2022-08-10 MED ORDER — FENTANYL BOLUS VIA INFUSION
50.0000 ug | INTRAVENOUS | Status: DC | PRN
  Administered 2022-08-10: 50 ug via INTRAVENOUS
  Administered 2022-08-10: 100 ug via INTRAVENOUS
  Administered 2022-08-11 (×4): 50 ug via INTRAVENOUS

## 2022-08-10 MED ORDER — MIDAZOLAM-SODIUM CHLORIDE 100-0.9 MG/100ML-% IV SOLN
0.5000 mg/h | INTRAVENOUS | Status: DC
  Administered 2022-08-10: 1 mg/h via INTRAVENOUS
  Filled 2022-08-10: qty 100

## 2022-08-10 MED ORDER — ORAL CARE MOUTH RINSE: 15.0000 mL | OROMUCOSAL | Status: DC | PRN

## 2022-08-10 MED ORDER — METHYLPREDNISOLONE SODIUM SUCC 125 MG IJ SOLR
60.0000 mg | Freq: Four times a day (QID) | INTRAMUSCULAR | Status: DC
  Administered 2022-08-10 – 2022-08-12 (×7): 60 mg via INTRAVENOUS
  Filled 2022-08-10 (×7): qty 2

## 2022-08-10 MED ORDER — DIPHENHYDRAMINE HCL 50 MG/ML IJ SOLN
25.0000 mg | Freq: Four times a day (QID) | INTRAMUSCULAR | Status: DC
  Administered 2022-08-10 – 2022-08-12 (×8): 25 mg via INTRAVENOUS
  Filled 2022-08-10 (×8): qty 1

## 2022-08-10 MED ORDER — EPINEPHRINE 0.3 MG/0.3ML IJ SOAJ
0.3000 mg | Freq: Once | INTRAMUSCULAR | Status: AC
  Administered 2022-08-10: 0.3 mg via INTRAMUSCULAR
  Filled 2022-08-10: qty 0.3

## 2022-08-10 MED ORDER — INSULIN ASPART 100 UNIT/ML IJ SOLN
0.0000 [IU] | INTRAMUSCULAR | Status: DC
  Administered 2022-08-10: 3 [IU] via SUBCUTANEOUS
  Administered 2022-08-10 – 2022-08-11 (×4): 2 [IU] via SUBCUTANEOUS

## 2022-08-10 MED ORDER — FENTANYL CITRATE PF 50 MCG/ML IJ SOSY
50.0000 ug | PREFILLED_SYRINGE | Freq: Once | INTRAMUSCULAR | Status: AC
  Administered 2022-08-10: 50 ug via INTRAVENOUS

## 2022-08-10 MED ORDER — MIDAZOLAM HCL 2 MG/2ML IJ SOLN
INTRAMUSCULAR | Status: AC
  Administered 2022-08-10: 2 mg via INTRAVENOUS
  Filled 2022-08-10: qty 2

## 2022-08-10 MED ORDER — FAMOTIDINE 20 MG PO TABS
20.0000 mg | ORAL_TABLET | Freq: Two times a day (BID) | ORAL | Status: DC
Start: 2022-08-10 — End: 2022-08-10

## 2022-08-10 MED ORDER — SODIUM CHLORIDE 0.9 % IV SOLN: INTRAVENOUS | Status: DC

## 2022-08-10 MED ORDER — ROCURONIUM BROMIDE 10 MG/ML (PF) SYRINGE
PREFILLED_SYRINGE | INTRAVENOUS | Status: AC
  Administered 2022-08-10: 100 mg via INTRAVENOUS
  Filled 2022-08-10: qty 10

## 2022-08-10 MED ORDER — PROPOFOL 1000 MG/100ML IV EMUL
0.0000 ug/kg/min | INTRAVENOUS | Status: DC
  Administered 2022-08-10: 35 ug/kg/min via INTRAVENOUS
  Filled 2022-08-10: qty 100

## 2022-08-10 MED ORDER — METHYLPREDNISOLONE SODIUM SUCC 125 MG IJ SOLR
125.0000 mg | Freq: Once | INTRAMUSCULAR | Status: AC
  Administered 2022-08-10: 125 mg via INTRAVENOUS
  Filled 2022-08-10: qty 2

## 2022-08-10 MED ORDER — FENTANYL CITRATE PF 50 MCG/ML IJ SOSY
50.0000 ug | PREFILLED_SYRINGE | INTRAMUSCULAR | Status: DC | PRN
  Administered 2022-08-10 (×2): 100 ug via INTRAVENOUS
  Filled 2022-08-10: qty 2

## 2022-08-10 MED ORDER — MIDAZOLAM HCL 2 MG/2ML IJ SOLN
INTRAMUSCULAR | Status: AC
  Administered 2022-08-10: 2 mg
  Filled 2022-08-10: qty 2

## 2022-08-10 MED ORDER — ROCURONIUM BROMIDE 10 MG/ML (PF) SYRINGE: 70.0000 mg | PREFILLED_SYRINGE | Freq: Once | INTRAVENOUS | Status: AC

## 2022-08-10 MED ORDER — DEXAMETHASONE SODIUM PHOSPHATE 10 MG/ML IJ SOLN
12.0000 mg | Freq: Once | INTRAMUSCULAR | Status: AC
  Administered 2022-08-10: 12 mg via INTRAVENOUS
  Filled 2022-08-10: qty 2

## 2022-08-10 MED ORDER — ACETAMINOPHEN 650 MG RE SUPP: 650.0000 mg | Freq: Four times a day (QID) | RECTAL | Status: DC | PRN

## 2022-08-10 MED ORDER — FENTANYL CITRATE PF 50 MCG/ML IJ SOSY
50.0000 ug | PREFILLED_SYRINGE | INTRAMUSCULAR | Status: DC | PRN
  Administered 2022-08-10: 50 ug via INTRAVENOUS

## 2022-08-10 MED ORDER — FENTANYL CITRATE PF 50 MCG/ML IJ SOSY
PREFILLED_SYRINGE | INTRAMUSCULAR | Status: AC
  Administered 2022-08-10: 100 ug
  Filled 2022-08-10: qty 2

## 2022-08-10 MED ORDER — FENTANYL 2500MCG IN NS 250ML (10MCG/ML) PREMIX INFUSION
50.0000 ug/h | INTRAVENOUS | Status: DC
  Administered 2022-08-10: 100 ug/h via INTRAVENOUS
  Administered 2022-08-11: 200 ug/h via INTRAVENOUS
  Filled 2022-08-10 (×2): qty 250

## 2022-08-10 MED ORDER — MIDAZOLAM BOLUS VIA INFUSION
0.0000 mg | INTRAVENOUS | Status: DC | PRN
  Administered 2022-08-11 (×3): 2 mg via INTRAVENOUS

## 2022-08-10 MED ORDER — FAMOTIDINE IN NACL 20-0.9 MG/50ML-% IV SOLN: 20.0000 mg | INTRAVENOUS | Status: DC

## 2022-08-10 MED ORDER — DOCUSATE SODIUM 50 MG/5ML PO LIQD
100.0000 mg | Freq: Two times a day (BID) | ORAL | Status: DC
  Administered 2022-08-10 – 2022-08-11 (×2): 100 mg
  Filled 2022-08-10 (×2): qty 10

## 2022-08-10 MED ORDER — POLYETHYLENE GLYCOL 3350 17 G PO PACK
17.0000 g | PACK | Freq: Every day | ORAL | Status: DC
  Administered 2022-08-11: 17 g
  Filled 2022-08-10: qty 1

## 2022-08-10 MED ORDER — PROPOFOL 1000 MG/100ML IV EMUL
INTRAVENOUS | Status: AC
  Administered 2022-08-10: 20 ug/kg/min via INTRAVENOUS
  Filled 2022-08-10: qty 100

## 2022-08-10 MED ORDER — EPINEPHRINE 1 MG/10ML IJ SOSY
PREFILLED_SYRINGE | INTRAMUSCULAR | Status: AC
  Filled 2022-08-10: qty 10

## 2022-08-10 MED ORDER — DIPHENHYDRAMINE HCL 50 MG/ML IJ SOLN
INTRAMUSCULAR | Status: AC
  Administered 2022-08-10: 25 mg via INTRAVENOUS
  Filled 2022-08-10: qty 1

## 2022-08-10 MED ORDER — ORAL CARE MOUTH RINSE
15.0000 mL | OROMUCOSAL | Status: DC
  Administered 2022-08-10 – 2022-08-11 (×10): 15 mL via OROMUCOSAL

## 2022-08-10 MED ORDER — C1 ESTERASE INHIBITOR (HUMAN) 500 UNITS IV KIT: 20.0000 [IU]/kg | PACK | Freq: Once | INTRAVENOUS | Status: DC

## 2022-08-10 MED ORDER — ACETAMINOPHEN 325 MG PO TABS: 650.0000 mg | ORAL_TABLET | Freq: Four times a day (QID) | ORAL | Status: DC | PRN

## 2022-08-10 NOTE — ED Notes (Signed)
Pt has significant swelling to lips and cheeks.  MD Schlossman at bedside.  No swelling in tongue or throat noted at this time.  Pt reports no difficulty breathing at this time.

## 2022-08-10 NOTE — ED Notes (Signed)
ENT at bedside with scope.

## 2022-08-10 NOTE — H&P (Signed)
Date: 08/10/2022               Patient Name:  Michael Rowland MRN: [id number]  DOB: [birth year] Age / Sex: 47 y.o., male   PCP: Pcp, No         Medical Service: Internal Medicine Teaching Service         Attending Physician: Dr. Mercie Eon, MD    First Contact: Dr. Rana Snare Pager: [phone]  Second Contact: Dr. Champ Mungo  Pager: [phone]       After Hours (After 5p/  First Contact Pager: [phone]  weekends / holidays): Second Contact Pager: [phone]   Chief Concern: lips swelling  History of Present Illness:   Michael Rowland is a 47 year old male living with polysubstance use, who presented to the emergency room for lip swelling.  He woke up this morning with swollen lips.  He did not experience any shortness of breath, dysphagia, urticaria, pruritus, itchy throat, vision issue, sinus issue, URI symptoms.  He is currently not taking any prescription medications.  He does not have any known allergies including medication or food.  His last meal was chicken hibachi it was not a new food for him.  He is using cocaine and marijuana but they are also not new.  Last cocaine use with the night before.  He said that this never happened to him before.  No family history of angioedema.  In the ED, patient was hemodynamically stable.  ENT was consulted who performed a endoscopy which confirmed patent airway.  Patient was given epinephrine, H2 blocker, PPI and steroid.  He was admitted for observation.  Meds:  -No active meds    Allergies: Allergies as of 08/10/2022   (No Known Allergies)   Past Medical History:  Diagnosis Date   Syncope 11/13/2013    Family History:   He reports no family history of diabetes or hypertension  Social History:  -Currently works as a Education administrator -Today he is lives in different places.  Has good support system at home. -He drinks about 2 x 40 ounces of beer a day.  Denies any history of alcohol withdrawal or seizure. -He smokes 1 pack every 3  days since he was 47 years old -Use marijuana and cocaine.  Review of Systems: A complete ROS was negative except as per HPI.   Physical Exam: Blood pressure 123/82, pulse 80, temperature 97.7 F (36.5 C), temperature source Oral, resp. rate 11, SpO2 98 %. Physical Exam Constitutional:      General: He is not in acute distress.    Appearance: He is not ill-appearing.  HENT:     Head: Normocephalic and atraumatic.     Mouth/Throat:     Comments: Significant lips edema, worse in the upper.  No tongue swelling or obstruction seen in the pharyngeal area.  No sinus pressure, rhinorrhea or discharge. Cardiovascular:     Rate and Rhythm: Normal rate and regular rhythm.  Pulmonary:     Effort: Pulmonary effort is normal.     Breath sounds: Normal breath sounds.  Abdominal:     General: Bowel sounds are normal. There is no distension.     Palpations: Abdomen is soft.     Tenderness: There is no abdominal tenderness.  Musculoskeletal:        General: Normal range of motion.     Cervical back: Normal range of motion.     Right lower leg: No edema.     Left lower leg: No  edema.  Skin:    General: Skin is warm.     Comments: No rash noted  Neurological:     Mental Status: He is alert. Mental status is at baseline.  Psychiatric:        Mood and Affect: Mood normal.         Assessment & Plan by Problem: Principal Problem:   Angioedema  Michael Rowland is a 47 year old male living with polysubstance use, who was admitted for idiopathic angioedema  Angioedema Fortunately he has no respiratory symptoms and his airway is patent.  Unclear etiology of his angioedema, whether it is mast cell or bradykinin mediated.  Patient is not on any medications except for cocaine/marijuana use, which could be potentially a cause.  No known allergies noted.  He has normal CRP/sed rate which makes infection or inflammation less likely.  Given the transient nature of his symptoms, superior vena cava  syndrome is less likely.  Also not consistent with cellulitis, facial lymphedema or other autoimmune conditions. -Pending C4.  Will check a C1 esterase inhibitor to rule out HAE -Also check TSH -Will continue H1/H2 blocker orally tomorrow -Hold off on additional steroid -Patient passed bedside swallow test.  Will order diet. -Closed monitoring tonight  Tobacco use disorder Can offer nicotine patch if needed  Regular diet Full code IVF: N/A DVT: Lovenox  Dispo: Admit patient to Observation with expected length of stay less than 2 midnights.  Signed: Doran Stabler, DO 08/10/2022, 1:42 PM  Pager: [phone] After 5pm on weekdays and 1pm on weekends: On Call pager: [phone]

## 2022-08-10 NOTE — Progress Notes (Signed)
RT unable to obtain ABG at this time. Pt agitated and attempting to pull ETT at this time. Multiple staff at bedside. RT will continue to monitor and be available as needed.

## 2022-08-10 NOTE — Hospital Course (Addendum)
Angioedema Patient presented to [facility] ED with CC of lip swelling that began the morning of admission. He initially did not have shortness of breath, dysphagia, urticaria, pruritus, throat itching. Patient denied prescription medications at that time but did endorse cocaine use the previous night. He did deny any history of this but in 2007 he was treated in the ED for similar concern. He was HDS on arrival. ENT was consulted who performed glidescope assessment and noted no evidence of laryngeal edema. He was given solu-medrol, epinephrine, H2 blocker, PPI, and admitted with scheduled doses of decadron. Later on day of admission he did develop the sensation that his throat was closing, difficulty managing secretions, and difficulty swallowing. He endorsed some trouble breathing. He received a dose of epinephrine, dexamethasone, benadryl, pepcid, and PCCM was consulted. He was transferred to ICU for elective intubation for airway protection. His angioedema improved and he tolerated extubation 06/20. Labs including C4, CRP, ESR were normal. C1 esterase is pending at time of discharge. Felt that this was more likely related to possible cross-contamination with an unknown food allergy. On day of discharge he had resolution of angioedema, was able to manage secretions well, swallow safely, and had no shortness of breath.    Tobacco use disorder Polysubstance use disorder Counseled patient on cessation of all substance use.  Subclinical hyperthyroidism TSH very low at 0.103. T4 added, pending at time of discharge.  Microcytic anemia Hgb initially normal but did drop mildly to 11.9. MCV low at 76.1.   Hyponatremia Hypochloremia Likely acute, mild. Suspect 2/2 to IVF received throughout admission.   Neck pain Neck pain persisted beyond resolution of angioedema. Reported on the R anterior aspect of his neck and some in his lower jaw/gums on the R. No signs of dental infection, afebrile throughout stay. Patient  does not follow with dentist OP. Not felt that there was an acute need for CT imaging.

## 2022-08-10 NOTE — Consult Note (Signed)
NAME:  Michael Rowland, MRN:  [id number], DOB:  November 17, 1975, LOS: 0 ADMISSION DATE:  08/10/2022, CONSULTATION DATE:  08/10/22  REFERRING MD: Mercie Eon CHIEF COMPLAINT:  Angioedema   History of Present Illness:  Patient is a 47 yr old male with a past medical history of polysubstance abuse (cocaine and marijuana) and history of angioedema in 2007 who came to [facility] ED with complaints mouth and lip swelling. Patient admitted by family medicine service for angioedema and treated with initially multiple administrations of IV decadron, epinephrine, and benadryl with no relief. Patient's angioedema started to progress since admission this morning with developing difficulty swallowing secretions. ENT and PCCM was consulted to assist with airway management.   Pertinent  Medical History  Drug abuse  Angioedema (2007)   Significant Hospital Events: Including procedures, antibiotic start and stop dates in addition to other pertinent events   6/19 Admit with Angioedema with unknown cause   Interim History / Subjective:  Patient alert and oriented, having difficulty maintaining secretions   Objective   Blood pressure 138/85, pulse 69, temperature 97.8 F (36.6 C), temperature source Axillary, resp. rate 17, SpO2 100 %.       No intake or output data in the 24 hours ending 08/10/22 1639 There were no vitals filed for this visit.  Examination: General: calm appearing middle aged male, sitting up in floor bed HENT: Normocephalic, Swollen lips/oral mucosa/uvula, difficulty maintaining secretions Lungs: CTA BL, no current respiratory distress, on RA  Cardiovascular: s1, s2 auscultated, RRR, no m/r/g  Abdomen: BS active  Extremities: no edema, moves all extremities  Neuro: Alert and oriented x4, follows commands  GU: intact   Resolved Hospital Problem list   N/a   Assessment & Plan:   Angioedema with unknown source  Suspecting food allergy (patient ate chicken hibachi last night)-possible cross  contamination with shell-fish, however has no known history of shell fish allergy or family history of angioedema  Last known use of cocaine/marijuana (6/17) per patient  P:  Immediate transfer to ICU for intubation to control airway  ENT notified by [facility] Medicine Service Continue scheduled IV solumedrol, benadryl, Pepcid  Place patient on continuous cardiac monitoring   Airway Management Intubated for airway protection secondary to angioedema  P:  Continue ventilator support with lung protective strategies  Wean PEEP and FiO2 for sats greater than 90%. Head of bed elevated 30 degrees. Plateau pressures less than 30 cm H20.  Follow intermittent chest x-ray and ABG.   Evaluate progression of angioedema prior to SBT/WUA Will need + cuff leak prior to extubation  Ensure adequate pulmonary hygiene  VAP bundle in place  PAD protocol, Utilize propofol as sedation  RASS -1, -2  Substance abuse (cocaine/marijuana)  P: Provide resources for cessation when appropriate  Supportive care    Best Practice (right click and "Reselect all SmartList Selections" daily)   Diet/type: NPO DVT prophylaxis: SCD GI prophylaxis: H2B Lines: N/A Foley:  N/A Code Status:  full code Last date of multidisciplinary goals of care discussion (patient updated prior to transfer to ICU)   Labs   CBC: Recent Labs  Lab 08/10/22 0921  WBC 6.3  NEUTROABS 3.3  HGB 14.4  HCT 43.7  MCV 76.7*  PLT 261    Basic Metabolic Panel: Recent Labs  Lab 08/10/22 0921  NA 131*  K 3.7  CL 96*  CO2 23  GLUCOSE 94  BUN 11  CREATININE 0.99  CALCIUM 9.5   GFR: CrCl cannot be calculated (Unknown  ideal weight.). Recent Labs  Lab 08/10/22 0921  WBC 6.3    Liver Function Tests: Recent Labs  Lab 08/10/22 0921  AST 103*  ALT 70*  ALKPHOS 79  BILITOT 1.1  PROT 8.5*  ALBUMIN 4.6   No results for input(s): "LIPASE", "AMYLASE" in the last 168 hours. No results for input(s): "AMMONIA" in the last 168  hours.  ABG    Component Value Date/Time   TCO2 27 08/30/2015 1606     Coagulation Profile: No results for input(s): "INR", "PROTIME" in the last 168 hours.  Cardiac Enzymes: No results for input(s): "CKTOTAL", "CKMB", "CKMBINDEX", "TROPONINI" in the last 168 hours.  HbA1C: Hgb A1c MFr Bld  Date/Time Value Ref Range Status  11/14/2013 02:04 AM 5.2 <5.7 % Final    Comment:    (NOTE)                                                                       According to the ADA Clinical Practice Recommendations for 2011, when HbA1c is used as a screening test:  >=6.5%   Diagnostic of Diabetes Mellitus           (if abnormal result is confirmed) 5.7-6.4%   Increased risk of developing Diabetes Mellitus References:Diagnosis and Classification of Diabetes Mellitus,Diabetes Care,2011,34(Suppl 1):S62-S69 and Standards of Medical Care in         Diabetes - 2011,Diabetes Care,2011,34 (Suppl 1):S11-S61.    CBG: No results for input(s): "GLUCAP" in the last 168 hours.  Review of Systems:   Please see the history of present illness. All other systems reviewed and are negative    Past Medical History:  He,  has a past medical history of Syncope (11/13/2013).   Surgical History:   Past Surgical History:  Procedure Laterality Date   HERNIA REPAIR       Social History:   reports that he has been smoking cigarettes. He has been smoking an average of .5 packs per day. He has never used smokeless tobacco. He reports that he does not drink alcohol and does not use drugs.   Family History:  His family history is not on file.   Allergies No Known Allergies   Home Medications  Prior to Admission medications   Medication Sig Start Date End Date Taking? Authorizing Provider  diphenhydrAMINE (BENADRYL) 25 MG tablet Take 1 tablet (25 mg total) by mouth every 6 (six) hours as needed for allergies. Patient taking differently: Take 25 mg by mouth every 6 (six) hours as needed for allergies  or itching. 08/30/15  Yes Benjiman Core, MD     Critical care time: 40 mins     CRITICAL CARE Performed by: Rochel Brome S-ACNP    Total critical care time: 40 minutes  Critical care time was exclusive of separately billable procedures and treating other patients.  Critical care was necessary to treat or prevent imminent or life-threatening deterioration.  Critical care was time spent personally by me on the following activities: development of treatment plan with patient and/or surrogate as well as nursing, discussions with consultants, evaluation of patient's response to treatment, examination of patient, obtaining history from patient or surrogate, ordering and performing treatments and interventions, ordering and review of laboratory studies, ordering and  review of radiographic studies, pulse oximetry and re-evaluation of patient's condition.

## 2022-08-10 NOTE — Procedures (Signed)
Intubation Procedure Note  Michael Rowland  [id number]  [birth year]  Date:08/10/22  Time:5:08 PM   Provider Performing:Pete E Tanja Port    Procedure: Intubation (31500)  Indication(s) Respiratory Failure  Consent Risks of the procedure as well as the alternatives and risks of each were explained to the patient and/or caregiver.  Consent for the procedure was obtained and is signed in the bedside chart   Anesthesia Etomidate and Rocuronium   Time Out Verified patient identification, verified procedure, site/side was marked, verified correct patient position, special equipment/implants available, medications/allergies/relevant history reviewed, required imaging and test results available.   Sterile Technique Usual hand hygeine, masks, and gloves were used   Procedure Description Patient positioned in bed supine.  Sedation given as noted above.  Patient was intubated with endotracheal tube using Glidescope.  View was Grade 3 only epiglottis .  Number of attempts was 1.  Colorimetric CO2 detector was consistent with tracheal placement.   Complications/Tolerance None; patient tolerated the procedure well. Chest X-ray is ordered to verify placement.   EBL Minimal   Specimen(s)    Simonne Martinet ACNP-BC [facility] Pulmonary/Critical Care Pager # [phone] OR # [phone] if no answer

## 2022-08-10 NOTE — ED Notes (Signed)
ENT at bedside

## 2022-08-10 NOTE — Significant Event (Signed)
Rapid Response Event Note   Reason for Call :  angioedema  Initial Focused Assessment:  Patient is alert and oriented.  He is sitting on the side of the bed spitting into a trash can.  He states that he can't swallow his secretions.  His voice is hoarse.  Upper lip is significantly swollen. He is currently in no distress, no stridor, denies difficulty breathing Lung sounds clear.   He states that he awoke this morning with swelling.  He states that he had cocaine 2 days ago.  He painted yesterday and ate hibachi chicken, otherwise no new medications or new foods.  BP 138/85  HR 62  RR 17  O2 sat 100% on RA  Interventions:  25 mg Benadryl IV Decadron 12mg  IV Epi Pen IM Pepcid 20mg  IV  Plan of Care:     Event Summary:   MD Notified:  Champ Mungo DO at bedside,  PCCM consulted:  Dr Burnadette Peter NP at bedside Call Time: 1554 Arrival Time: 1557 End Time: 1655  Marcellina Millin, RN

## 2022-08-10 NOTE — ED Notes (Signed)
ED TO INPATIENT HANDOFF REPORT  ED Nurse Name and Phone #: Brittanni Cariker [phone]  S Name/Age/Gender Michael Rowland 47 y.o. male Room/Bed: 033C/033C  Code Status   Code Status: Full Code  Home/SNF/Other Home Patient oriented to: self, place, time, and situation Is this baseline? Yes   Triage Complete: Triage complete  Chief Complaint Angioedema [T78.3XXA]  Triage Note Pt presents w/ significant facial swelling.  Sts he woke up w/ swelling.  Pain score 4/10.  Pt denies HTN medications, new foods, new soaps, etc.  Denies tongue and throat swelling and SOB.      Allergies No Known Allergies  Level of Care/Admitting Diagnosis ED Disposition     ED Disposition  Admit   Condition  --   Comment  Hospital Area: [facility] [[id number]]  Level of Care: Progressive [102]  Admit to Progressive based on following criteria: MULTISYSTEM THREATS such as stable sepsis, metabolic/electrolyte imbalance with or without encephalopathy that is responding to early treatment.  Admit to Progressive based on following criteria: ACUTE MENTAL DISORDER-RELATED Drug/Alcohol Ingestion/Overdose/Withdrawal, Suicidal Ideation/attempt requiring safety sitter and < Q2h monitoring/assessments, moderate to severe agitation that is managed with medication/sitter, CIWA-Ar score < 20.  May place patient in observation at [facility] or [facility] if equivalent level of care is available:: No  Covid Evaluation: Asymptomatic - no recent exposure (last 10 days) testing not required  Diagnosis: Angioedema [190627]  Admitting Physician: Mercie Eon [[id number]]  Attending Physician: Mercie Eon [[id number]]          B Medical/Surgery History Past Medical History:  Diagnosis Date   Syncope 11/13/2013   Past Surgical History:  Procedure Laterality Date   HERNIA REPAIR       A IV Location/Drains/Wounds Patient Lines/Drains/Airways Status     Active Line/Drains/Airways     Name Placement date  Placement time Site Days   Peripheral IV 08/10/22 20 G Anterior;Left;Proximal Forearm 08/10/22  0914  Forearm  less than 1            Intake/Output Last 24 hours No intake or output data in the 24 hours ending 08/10/22 1411  Labs/Imaging Results for orders placed or performed during the hospital encounter of 08/10/22 (from the past 48 hour(s))  CBC with Differential     Status: Abnormal   Collection Time: 08/10/22  9:21 AM  Result Value Ref Range   WBC 6.3 4.0 - 10.5 K/uL   RBC 5.70 4.22 - 5.81 MIL/uL   Hemoglobin 14.4 13.0 - 17.0 g/dL   HCT 95.6 21.3 - 08.6 %   MCV 76.7 (L) 80.0 - 100.0 fL   MCH 25.3 (L) 26.0 - 34.0 pg   MCHC 33.0 30.0 - 36.0 g/dL   RDW 57.8 (H) 46.9 - 62.9 %   Platelets 261 150 - 400 K/uL   nRBC 0.0 0.0 - 0.2 %   Neutrophils Relative % 53 %   Neutro Abs 3.3 1.7 - 7.7 K/uL   Lymphocytes Relative 30 %   Lymphs Abs 1.9 0.7 - 4.0 K/uL   Monocytes Relative 16 %   Monocytes Absolute 1.0 0.1 - 1.0 K/uL   Eosinophils Relative 1 %   Eosinophils Absolute 0.1 0.0 - 0.5 K/uL   Basophils Relative 0 %   Basophils Absolute 0.0 0.0 - 0.1 K/uL   Immature Granulocytes 0 %   Abs Immature Granulocytes 0.02 0.00 - 0.07 K/uL    Comment: Performed at [facility], [address] [place], [place] [address]  Comprehensive metabolic panel     Status: Abnormal   Collection Time: 08/10/22  9:21 AM  Result Value Ref Range   Sodium 131 (L) 135 - 145 mmol/L   Potassium 3.7 3.5 - 5.1 mmol/L   Chloride 96 (L) 98 - 111 mmol/L   CO2 23 22 - 32 mmol/L   Glucose, Bld 94 70 - 99 mg/dL    Comment: Glucose reference range applies only to samples taken after fasting for at least 8 hours.   BUN 11 6 - 20 mg/dL   Creatinine, Ser 1.61 0.61 - 1.24 mg/dL   Calcium 9.5 8.9 - 09.6 mg/dL   Total Protein 8.5 (H) 6.5 - 8.1 g/dL   Albumin 4.6 3.5 - 5.0 g/dL   AST 045 (H) 15 - 41 U/L   ALT 70 (H) 0 - 44 U/L   Alkaline Phosphatase 79 38 - 126 U/L   Total Bilirubin 1.1 0.3 - 1.2 mg/dL    GFR, Estimated >40 >98 mL/min    Comment: (NOTE) Calculated using the CKD-EPI Creatinine Equation (2021)    Anion gap 12 5 - 15    Comment: Performed at [facility], [address] [place], [place] [address]  Sedimentation rate     Status: None   Collection Time: 08/10/22  9:21 AM  Result Value Ref Range   Sed Rate 1 0 - 16 mm/hr    Comment: Performed at [facility], [address] [place], [place] [address]  C-reactive protein     Status: None   Collection Time: 08/10/22  9:21 AM  Result Value Ref Range   CRP <0.5 <1.0 mg/dL    Comment: Performed at [facility], [address] [place], [place] [address]   No results found.  Pending Labs Unresulted Labs (From admission, onward)     Start     Ordered   08/11/22 0500  Basic metabolic panel  Tomorrow morning,   R        08/10/22 1340   08/11/22 0500  CBC  Tomorrow morning,   R        08/10/22 1340   08/10/22 1343  C1 Esterase Inhibitor  Once,   R        08/10/22 1342   08/10/22 1342  TSH  Once,   R        08/10/22 1341   08/10/22 1339  HIV Antibody (routine testing w rflx)  (HIV Antibody (Routine testing w reflex) panel)  Once,   R        08/10/22 1340   08/10/22 0918  C4 complement  Once,   URGENT        08/10/22 0917            Vitals/Pain Today's Vitals   08/10/22 0945 08/10/22 1000 08/10/22 1230 08/10/22 1300  BP: 132/79 127/70  123/82  Pulse: 66 65  80  Resp: 16 17  11   Temp:   97.7 F (36.5 C)   TempSrc:   Oral   SpO2: 98% 99%  98%  PainSc:        Isolation Precautions No active isolations  Medications Medications  enoxaparin (LOVENOX) injection 40 mg (has no administration in time range)  acetaminophen (TYLENOL) tablet 650 mg (has no administration in time range)    Or  acetaminophen (TYLENOL) suppository 650 mg (has no administration in time range)  loratadine (CLARITIN) tablet 10 mg (has no administration in time range)  famotidine (PEPCID)  tablet 20 mg (has no  administration in time range)  EPINEPHrine (EPI-PEN) injection 0.3 mg (0.3 mg Intramuscular Given 08/10/22 0918)  methylPREDNISolone sodium succinate (SOLU-MEDROL) 125 mg/2 mL injection 125 mg (125 mg Intravenous Given 08/10/22 0920)  famotidine (PEPCID) IVPB 20 mg premix (0 mg Intravenous Stopped 08/10/22 0952)  diphenhydrAMINE (BENADRYL) injection 25 mg (25 mg Intravenous Given 08/10/22 0920)  dexamethasone (DECADRON) injection 12 mg (12 mg Intravenous Given 08/10/22 1211)    Mobility walks     Focused Assessments    R Recommendations: See Admitting Provider Note  Report given to:   Additional Notes:

## 2022-08-10 NOTE — ED Provider Notes (Signed)
[place] EMERGENCY DEPARTMENT AT [facility] Provider Note   CSN: [id number] Arrival date & time: 08/10/22  7829     History  Chief Complaint  Patient presents with   Facial Swelling    Michael Rowland is a 47 y.o. male.  HPI     47 year old male with no significant medical history presents with concern for facial swelling.  He was in her normal state of health, woke up this morning with significant swelling.  He woke up around 7 AM this way.  Initially reports the swelling is on the left side, now has brought to the right side of his lips and lower face.  Denies shortness of breath, difficulty swallowing, sensation of throat swelling or tongue swelling, rash, nausea, vomiting, abdominal pain, fever, dental pain.  He does feel that his voice sounds somewhat different.  He does have drooling likely secondary to his significant lip swelling.  He does not take any medications.  Denies any known new exposures.  Denies any family history of hereditary angioedema.  Has not had swelling like this before.    Past Medical History:  Diagnosis Date   Syncope 11/13/2013     Home Medications Prior to Admission medications   Medication Sig Start Date End Date Taking? Authorizing Provider  diphenhydrAMINE (BENADRYL) 25 MG tablet Take 1 tablet (25 mg total) by mouth every 6 (six) hours as needed for allergies. Patient taking differently: Take 25 mg by mouth every 6 (six) hours as needed for allergies or itching. 08/30/15  Yes Benjiman Core, MD      Allergies    Patient has no known allergies.    Review of Systems   Review of Systems  Physical Exam Updated Vital Signs BP (!) 83/68 (BP Location: Right Arm)   Pulse 66   Temp 98.2 F (36.8 C) (Oral)   Resp 20   Ht 6\' 4"  (1.93 m)   Wt 66.6 kg   SpO2 98%   BMI 17.87 kg/m  Physical Exam Vitals and nursing note reviewed.  Constitutional:      General: He is not in acute distress.    Appearance: He is  well-developed. He is not diaphoretic.  HENT:     Head: Normocephalic and atraumatic.     Comments: Severe lip swelling No tongue swelling or throat swelling No stridor No acute dental abnormalities  Eyes:     Conjunctiva/sclera: Conjunctivae normal.  Cardiovascular:     Rate and Rhythm: Normal rate and regular rhythm.     Heart sounds: Normal heart sounds. No murmur heard.    No friction rub. No gallop.  Pulmonary:     Effort: Pulmonary effort is normal. No respiratory distress.     Breath sounds: Normal breath sounds. No wheezing or rales.  Abdominal:     General: There is no distension.     Palpations: Abdomen is soft.     Tenderness: There is no abdominal tenderness. There is no guarding.  Musculoskeletal:     Cervical back: Normal range of motion.  Skin:    General: Skin is warm and dry.  Neurological:     Mental Status: He is alert and oriented to person, place, and time.     ED Results / Procedures / Treatments   Labs (all labs ordered are listed, but only abnormal results are displayed) Labs Reviewed  CBC WITH DIFFERENTIAL/PLATELET - Abnormal; Notable for the following components:      Result Value   MCV 76.7 (*)  MCH 25.3 (*)    RDW 16.0 (*)    All other components within normal limits  COMPREHENSIVE METABOLIC PANEL - Abnormal; Notable for the following components:   Sodium 131 (*)    Chloride 96 (*)    Total Protein 8.5 (*)    AST 103 (*)    ALT 70 (*)    All other components within normal limits  TSH - Abnormal; Notable for the following components:   TSH 0.103 (*)    All other components within normal limits  GLUCOSE, CAPILLARY - Abnormal; Notable for the following components:   Glucose-Capillary 162 (*)    All other components within normal limits  GLUCOSE, CAPILLARY - Abnormal; Notable for the following components:   Glucose-Capillary 154 (*)    All other components within normal limits  POCT I-STAT 7, (LYTES, BLD GAS, ICA,H+H) - Abnormal;  Notable for the following components:   pH, Arterial 7.480 (*)    pCO2 arterial 27.7 (*)    pO2, Arterial 269 (*)    TCO2 21 (*)    Sodium 130 (*)    All other components within normal limits  SEDIMENTATION RATE  C-REACTIVE PROTEIN  HIV ANTIBODY (ROUTINE TESTING W REFLEX)  C4 COMPLEMENT  C1 ESTERASE INHIBITOR  BASIC METABOLIC PANEL  CBC  TRIGLYCERIDES  BLOOD GAS, ARTERIAL  HEMOGLOBIN A1C    EKG None  Radiology DG Chest Port 1 View  Result Date: 08/10/2022 CLINICAL DATA:  Angioedema. EXAM: PORTABLE CHEST 1 VIEW COMPARISON:  November 14, 2014. FINDINGS: The heart size and mediastinal contours are within normal limits. Endotracheal and nasogastric tubes are in grossly good position. Both lungs are clear. The visualized skeletal structures are unremarkable. IMPRESSION: No active disease. Electronically Signed   By: Lupita Raider M.D.   On: 08/10/2022 18:11    Procedures .Critical Care  Performed by: Alvira Monday, MD Authorized by: Alvira Monday, MD   Critical care provider statement:    Critical care time (minutes):  30   Critical care was time spent personally by me on the following activities:  Development of treatment plan with patient or surrogate, discussions with consultants, evaluation of patient's response to treatment, examination of patient, ordering and review of laboratory studies, ordering and review of radiographic studies, ordering and performing treatments and interventions, pulse oximetry, re-evaluation of patient's condition and review of old charts     Medications Ordered in ED Medications  enoxaparin (LOVENOX) injection 40 mg (40 mg Subcutaneous Not Given 08/10/22 1425)  dexamethasone (DECADRON) 10 MG/ML injection (  Canceled Entry 08/10/22 1613)  dexamethasone (DECADRON) 10 MG/ML injection (  Canceled Entry 08/10/22 1613)  EPINEPHrine (ADRENALIN) 1 MG/10ML injection (  Canceled Entry 08/10/22 1706)  methylPREDNISolone sodium succinate (SOLU-MEDROL)  125 mg/2 mL injection 60 mg (60 mg Intravenous Given 08/10/22 1805)  famotidine (PEPCID) IVPB 20 mg premix ( Intravenous Canceled Entry 08/10/22 1745)  diphenhydrAMINE (BENADRYL) injection 25 mg (25 mg Intravenous Given 08/10/22 1805)  docusate (COLACE) 50 MG/5ML liquid 100 mg (has no administration in time range)  polyethylene glycol (MIRALAX / GLYCOLAX) packet 17 g ( Per Tube Canceled Entry 08/10/22 1746)  insulin aspart (novoLOG) injection 0-15 Units (3 Units Subcutaneous Given 08/10/22 1928)  Oral care mouth rinse (15 mLs Mouth Rinse Given 08/10/22 1746)  Oral care mouth rinse (has no administration in time range)  fentaNYL (SUBLIMAZE) injection 50 mcg (50 mcg Intravenous Given 08/10/22 1743)  fentaNYL in NS (58mcg/ml) infusion-PREMIX (200 mcg/hr Intravenous Infusion Verify 08/10/22 2000)  fentaNYL (SUBLIMAZE) bolus via infusion 50-100 mcg (50 mcg Intravenous Bolus from Bag 08/10/22 1933)  midazolam (VERSED) 100 mg/100 mL (1 mg/mL) premix infusion (1 mg/hr Intravenous Infusion Verify 08/10/22 2000)  propofol (DIPRIVAN) 1000 MG/100ML infusion (35 mcg/kg/min  66.6 kg Intravenous Infusion Verify 08/10/22 2000)  midazolam (VERSED) bolus via infusion 0-5 mg (has no administration in time range)  EPINEPHrine (EPI-PEN) injection 0.3 mg (0.3 mg Intramuscular Given 08/10/22 0918)  methylPREDNISolone sodium succinate (SOLU-MEDROL) 125 mg/2 mL injection 125 mg (125 mg Intravenous Given 08/10/22 0920)  famotidine (PEPCID) IVPB 20 mg premix (0 mg Intravenous Stopped 08/10/22 0952)  diphenhydrAMINE (BENADRYL) injection 25 mg (25 mg Intravenous Given 08/10/22 0920)  dexamethasone (DECADRON) injection 12 mg (12 mg Intravenous Given 08/10/22 1211)  EPINEPHrine (EPI-PEN) injection 0.3 mg (0.3 mg Intramuscular Given 08/10/22 1610)  diphenhydrAMINE (BENADRYL) injection 25 mg (25 mg Intravenous Given 08/10/22 1601)  dexamethasone (DECADRON) injection 12 mg (12 mg Intravenous Given 08/10/22 1607)  famotidine  (PEPCID) IVPB 20 mg premix (0 mg Intravenous Stopped 08/10/22 1648)  rocuronium bromide 10 mg/mL (PF) syringe (100 mg Intravenous Given 08/10/22 1705)  etomidate (AMIDATE) injection 20 mg (20 mg Intravenous Given 08/10/22 1659)  midazolam (VERSED) 2 MG/2ML injection (2 mg  Given 08/10/22 1701)  fentaNYL (SUBLIMAZE) injection 50 mcg (50 mcg Intravenous Given 08/10/22 1745)    ED Course/ Medical Decision Making/ A&P                              47 year old male with no significant medical history presents with concern for facial swelling.  Presents with very significant lip swelling.  Initial exam with no tongue swelling, visible pharyngeal swelling, no stridor, clear dysphonia or dysphagia.  At this time, do not feel he requires intubation for his angioedema.  Given description of some change in voice per patient, consulted ENT for evaluation.  Given IM epi, Solu-Medrol, Benadryl and H2 blocker.  Ordered labs including ESR, CRP, C4 complement.  No family history of hereditary angioedema, or history of angioedema for patient in the past and do not feel he would likely benefit from C1 inhibitor concentrate.  On chart review, he has had angioedema previously in 2017, however at this time hereditary angioeema seems less likely.  Dr. Darl Pikes evaluated and at this time does not feel scope indicated.  Continued observation in the ED.  Reports feeling that something is in the back of his throat-concern for angioedema left side.  Re-consulted ENT Dr. Marene Lenz who came to bedside for evaluation and performed NP scope and did not see significant swelling.  Decadron 12mg  ordered. Admitted for continued observation to internal medicine team.           Final Clinical Impression(s) / ED Diagnoses Final diagnoses:  Angioedema, initial encounter    Rx / DC Orders ED Discharge Orders     None         Alvira Monday, MD 08/10/22 2143

## 2022-08-10 NOTE — Progress Notes (Signed)
Patient ID: Michael Rowland, male   DOB: [birth year], 47 y.o.   MRN: [id number]  Patient arrived to the floor and during his initial assessment started to spit and cough, stated he couldn't breath. Paged MD, called for charge nurse and rapid response nurse.   BP 138/85 (BP Location: Right Arm)   Pulse 69   Temp 97.8 F (36.6 C) (Axillary)   Resp 17   Ht 6\' 4"  (1.93 m)   SpO2 100%   BMI 18.26 kg/m    IV Benadryl, decadron and Pepcid given. MD order to transfer to ICU to intubate.   Lidia Collum, RN

## 2022-08-10 NOTE — Consult Note (Signed)
ENT CONSULT:  Reason for Consult: Lips/Facial swelling  Referring Physician:  Alvira Monday, MD  HPI: Michael Rowland is an 47 y.o. male who presented to ED this morning with complaints of sudden onset of lip and facial swelling. Patient states that he woke up with the symptoms, and denies any change from his usual diet last night. He has no known history of allergies and does not take any medication routinely. He has never experienced facial swelling before and denies known family history of similar symptoms. At time of exam, patient resting comfortably. He denies dysphagia, odynophagia, shortness of breath or difficulty breathing.    Past Medical History:  Diagnosis Date   Syncope 11/13/2013    Past Surgical History:  Procedure Laterality Date   HERNIA REPAIR      History reviewed. No pertinent family history.  Social History:  reports that he has been smoking cigarettes. He has been smoking an average of .5 packs per day. He has never used smokeless tobacco. He reports that he does not drink alcohol and does not use drugs.  Allergies: No Known Allergies  Medications: I have reviewed the patient's current medications.  Results for orders placed or performed during the hospital encounter of 08/10/22 (from the past 48 hour(s))  CBC with Differential     Status: Abnormal   Collection Time: 08/10/22  9:21 AM  Result Value Ref Range   WBC 6.3 4.0 - 10.5 K/uL   RBC 5.70 4.22 - 5.81 MIL/uL   Hemoglobin 14.4 13.0 - 17.0 g/dL   HCT 16.1 09.6 - 04.5 %   MCV 76.7 (L) 80.0 - 100.0 fL   MCH 25.3 (L) 26.0 - 34.0 pg   MCHC 33.0 30.0 - 36.0 g/dL   RDW 40.9 (H) 81.1 - 91.4 %   Platelets 261 150 - 400 K/uL   nRBC 0.0 0.0 - 0.2 %   Neutrophils Relative % 53 %   Neutro Abs 3.3 1.7 - 7.7 K/uL   Lymphocytes Relative 30 %   Lymphs Abs 1.9 0.7 - 4.0 K/uL   Monocytes Relative 16 %   Monocytes Absolute 1.0 0.1 - 1.0 K/uL   Eosinophils Relative 1 %   Eosinophils Absolute 0.1 0.0 - 0.5 K/uL    Basophils Relative 0 %   Basophils Absolute 0.0 0.0 - 0.1 K/uL   Immature Granulocytes 0 %   Abs Immature Granulocytes 0.02 0.00 - 0.07 K/uL    Comment: Performed at [facility], [address] [place], [place] [address]  Comprehensive metabolic panel     Status: Abnormal   Collection Time: 08/10/22  9:21 AM  Result Value Ref Range   Sodium 131 (L) 135 - 145 mmol/L   Potassium 3.7 3.5 - 5.1 mmol/L   Chloride 96 (L) 98 - 111 mmol/L   CO2 23 22 - 32 mmol/L   Glucose, Bld 94 70 - 99 mg/dL    Comment: Glucose reference range applies only to samples taken after fasting for at least 8 hours.   BUN 11 6 - 20 mg/dL   Creatinine, Ser 6.21 0.61 - 1.24 mg/dL   Calcium 9.5 8.9 - 30.8 mg/dL   Total Protein 8.5 (H) 6.5 - 8.1 g/dL   Albumin 4.6 3.5 - 5.0 g/dL   AST 657 (H) 15 - 41 U/L   ALT 70 (H) 0 - 44 U/L   Alkaline Phosphatase 79 38 - 126 U/L   Total Bilirubin 1.1 0.3 - 1.2 mg/dL   GFR,  Estimated >60 >60 mL/min    Comment: (NOTE) Calculated using the CKD-EPI Creatinine Equation (2021)    Anion gap 12 5 - 15    Comment: Performed at [facility], [address] [place], [place] [address]  C-reactive protein     Status: None   Collection Time: 08/10/22  9:21 AM  Result Value Ref Range   CRP <0.5 <1.0 mg/dL    Comment: Performed at [facility], [address], [place], [place] [address]    No results found.  ROS:ROS  Blood pressure 127/70, pulse 65, temperature (!) 97.3 F (36.3 C), temperature source Axillary, resp. rate 17, SpO2 99 %.  PHYSICAL EXAM: CONSTITUTIONAL: well developed, nourished, no distress and alert and oriented x 3 PULMONARY/CHEST WALL: effort normal and no stridor, no stertor, no dysphonia. Patient with articulation difficulties secondary to lip swelling. HENT: Head : normocephalic and atraumatic. Significant, bilateral, symmetric mid face swelling with no erythema or palpable fluctuance. Ears: Right ear:   canal normal, external ear normal  and hearing normal Left ear:   canal normal, external ear normal and hearing normal Nose: nose normal and no purulence Mouth/Throat:  Mouth: Significant edema of upper and lower lips, which does not extend to remainder of oral cavity or oropharynx. Throat: oropharynx clear and moist with no appreciable edema.Tonsils 1+, able to visualize posterior pharyngeal wall without difficulty.  Mucous membranes: normal EYES: conjunctiva normal, EOM normal and PERRL NECK: supple, trachea normal and no thyromegaly or cervical LAD  Studies Reviewed:None  Assessment/Plan: Michael Rowland is a 47 y/o M with sudden onset of lip/face swelling. Exam today is reassuring. There is no evidence of oropharyngeal involvement, and patient has no evidence of stridor, hoarseness, increased work of breathing, or shortness of breath. Mallampati I. Recommend treatment with Decadron, Epinephrine, H1/H2 blockers and PPI. Medical management as per primary.   Thank you for allowing me to participate in the care of this patient. Please do not hesitate to contact me with any questions or concerns.   Laren Boom, DO Otolaryngology [facility] ENT Cell: [phone]   08/10/2022, 10:27 AM

## 2022-08-10 NOTE — Progress Notes (Addendum)
[name]  ENT called by ED provider due to concerns about possible intraoral swelling vs secretions... no change in patient's respiratory status, work of breathing, or phonation. Patients endorses nasal congestion, no other new symptoms.   Procedure: Transnasal fiberoptic laryngoscopy Anesth: Topical with 4% lidocaine Compl: None Findings: Flexible laryngoscopy shows patent anterior nasal cavity with minimal crusting, no discharge or infection. Moderate inferior turbinate hypertrophy bilaterally with dry mucosa.  Mild edema of left base of tongue, which does not cause any narrowing of the airway. Normal supraglottis.Normal vocal cord mobility without vocal cord nodule, mass, polyp or tumor. Hypopharynx normal without mass, pooling of secretions or aspiration.  Airway widely patent. Description:  After discussing risks, benefits, and alternatives, the patient was placed in a seated position and the right nasal passage was sprayed with topical anesthetic.  The fiberoptic scope was passed through the right nasal passage to view the pharynx and larynx.  Findings are noted above.  The scope was then removed and he was returned to nursing care in stable condition.   Assessment/Plan: Michael Rowland is a 47 y/o M with sudden onset of lip/face swelling. Second examination today is largely unchanged from examination this morning.  Patient is stable from a respiratory perspective at this time.  Fiberoptic laryngoscopy performed at bedside as per ED provider request demonstrates widely patent airway with no evidence of mass effect, significant edema or impending airway obstruction.  Patient was reassured. Recommend treatment with Decadron, Epinephrine, H1/H2 blockers and PPI with continued close observation. Medical management as per primary.   ENT will sign off at this time.    Laren Boom, DO Otolaryngology [facility] ENT Cell: [phone]   ADDENDUM: ENT called by Hospital Medicine  for progressive airway swelling and increased work of breathing, improved with administration of IV Decadron. Patient subsequently transferred to ICU for intubation. I was present at bedside for intubation, which was done without difficulty by CCM. Tongue edema had increased, but no evidence of laryngeal edema on Glidescope. Recommend continued medical management of edema as noted above, please call ENT with any questions or concerns.

## 2022-08-10 NOTE — Progress Notes (Signed)
Paged by RN that patient reported feeling like it was getting harder to swallow and that his throat was closing, and that he was spitting up secretions.   I evaluated patient at bedside and on my assessment he was sitting at the edge of the bed, leaned over, spitting out secretions into a trash can. The secretions were clear. He had notable angioedema of his lower face bilaterally including upper and lower lips. His uvula and tongue were also edematous and his airway was difficult to visualize as a result.   I ordered epinephrine 0.3 mg, dexamethasone 12 mg, benadryl IV 25 mg, and pepcid 20 mg IV infusion. He reported minimal change in his symptoms with administration of this combination.  I simultaneously consulted PCCM as there is high concern that he will fail to protect his airway and need emergent intervention. In speaking with their team, I was advised also to reach out to ENT. I spoke with Dr. Marene Lenz with ENT to provide clinical update. She discussed that when she assessed him in the ED, his oropharynx was clear and there was no evidence of airway obstruction. She did not feel that, should the patient need intubation, it would need to be done in the OR under anesthesia. She explained that if she was needed, she would be able to assists PCCM with intubation. I conveyed this with the PCCM team.   At this time the patient has been transferred to the ICU for closer monitoring and concern for intubation needs.  Michael Mungo, DO

## 2022-08-10 NOTE — ED Triage Notes (Signed)
Pt presents w/ significant facial swelling.  Sts he woke up w/ swelling.  Pain score 4/10.  Pt denies HTN medications, new foods, new soaps, etc.  Denies tongue and throat swelling and SOB.

## 2022-08-11 ENCOUNTER — Observation Stay (HOSPITAL_COMMUNITY): Payer: Commercial Managed Care - HMO

## 2022-08-11 DIAGNOSIS — R600 Localized edema: Secondary | ICD-10-CM | POA: Diagnosis present

## 2022-08-11 DIAGNOSIS — Z7151 Drug abuse counseling and surveillance of drug abuser: Secondary | ICD-10-CM | POA: Diagnosis not present

## 2022-08-11 DIAGNOSIS — E878 Other disorders of electrolyte and fluid balance, not elsewhere classified: Secondary | ICD-10-CM | POA: Diagnosis present

## 2022-08-11 DIAGNOSIS — R499 Unspecified voice and resonance disorder: Secondary | ICD-10-CM | POA: Diagnosis present

## 2022-08-11 DIAGNOSIS — E871 Hypo-osmolality and hyponatremia: Secondary | ICD-10-CM | POA: Diagnosis present

## 2022-08-11 DIAGNOSIS — F1410 Cocaine abuse, uncomplicated: Secondary | ICD-10-CM | POA: Diagnosis present

## 2022-08-11 DIAGNOSIS — F1290 Cannabis use, unspecified, uncomplicated: Secondary | ICD-10-CM | POA: Diagnosis present

## 2022-08-11 DIAGNOSIS — D509 Iron deficiency anemia, unspecified: Secondary | ICD-10-CM | POA: Diagnosis present

## 2022-08-11 DIAGNOSIS — J9382 Other air leak: Secondary | ICD-10-CM | POA: Diagnosis not present

## 2022-08-11 DIAGNOSIS — X58XXXA Exposure to other specified factors, initial encounter: Secondary | ICD-10-CM | POA: Diagnosis present

## 2022-08-11 DIAGNOSIS — J9601 Acute respiratory failure with hypoxia: Secondary | ICD-10-CM | POA: Diagnosis not present

## 2022-08-11 DIAGNOSIS — F17210 Nicotine dependence, cigarettes, uncomplicated: Secondary | ICD-10-CM | POA: Diagnosis present

## 2022-08-11 DIAGNOSIS — T783XXD Angioneurotic edema, subsequent encounter: Secondary | ICD-10-CM

## 2022-08-11 DIAGNOSIS — M542 Cervicalgia: Secondary | ICD-10-CM | POA: Diagnosis present

## 2022-08-11 DIAGNOSIS — T783XXA Angioneurotic edema, initial encounter: Secondary | ICD-10-CM | POA: Diagnosis present

## 2022-08-11 DIAGNOSIS — J9600 Acute respiratory failure, unspecified whether with hypoxia or hypercapnia: Secondary | ICD-10-CM | POA: Diagnosis present

## 2022-08-11 DIAGNOSIS — E0590 Thyrotoxicosis, unspecified without thyrotoxic crisis or storm: Secondary | ICD-10-CM | POA: Diagnosis present

## 2022-08-11 LAB — BASIC METABOLIC PANEL
Anion gap: 14 (ref 5–15)
BUN: 15 mg/dL (ref 6–20)
CO2: 20 mmol/L — ABNORMAL LOW (ref 22–32)
Calcium: 9.6 mg/dL (ref 8.9–10.3)
Chloride: 98 mmol/L (ref 98–111)
Creatinine, Ser: 0.96 mg/dL (ref 0.61–1.24)
GFR, Estimated: >60 mL/min (ref >60)
Glucose, Bld: 122 mg/dL — ABNORMAL HIGH (ref 70–99)
Potassium: 4 mmol/L (ref 3.5–5.1)
Sodium: 132 mmol/L — ABNORMAL LOW (ref 135–145)

## 2022-08-11 LAB — GLUCOSE, CAPILLARY
Glucose-Capillary: 131 mg/dL — ABNORMAL HIGH (ref 70–99)
Glucose-Capillary: 121 mg/dL — ABNORMAL HIGH (ref 70–99)
Glucose-Capillary: 136 mg/dL — ABNORMAL HIGH (ref 70–99)
Glucose-Capillary: 144 mg/dL — ABNORMAL HIGH (ref 70–99)
Glucose-Capillary: 145 mg/dL — ABNORMAL HIGH (ref 70–99)

## 2022-08-11 LAB — CBC
HCT: 38.6 % — ABNORMAL LOW (ref 39.0–52.0)
Hemoglobin: 12.8 g/dL — ABNORMAL LOW (ref 13.0–17.0)
MCH: 24.6 pg — ABNORMAL LOW (ref 26.0–34.0)
MCHC: 33.2 g/dL (ref 30.0–36.0)
MCV: 74.2 fL — ABNORMAL LOW (ref 80.0–100.0)
Platelets: 210 10*3/uL (ref 150–400)
RBC: 5.2 MIL/uL (ref 4.22–5.81)
RDW: 15.2 % (ref 11.5–15.5)
WBC: 5 10*3/uL (ref 4.0–10.5)
nRBC: 0 % (ref 0.0–0.2)

## 2022-08-11 LAB — MRSA NEXT GEN BY PCR, NASAL: MRSA by PCR Next Gen: NOT DETECTED

## 2022-08-11 LAB — HEMOGLOBIN A1C
Hgb A1c MFr Bld: 5.5 % (ref 4.8–5.6)
Mean Plasma Glucose: 111.15 mg/dL

## 2022-08-11 LAB — C4 COMPLEMENT: Complement C4, Body Fluid: 21 mg/dL (ref 12–38)

## 2022-08-11 MED ORDER — FAMOTIDINE IN NACL 20-0.9 MG/50ML-% IV SOLN
20.0000 mg | Freq: Two times a day (BID) | INTRAVENOUS | Status: DC
  Administered 2022-08-11: 20 mg via INTRAVENOUS
  Filled 2022-08-11: qty 50

## 2022-08-11 MED ORDER — INSULIN ASPART 100 UNIT/ML IJ SOLN
0.0000 [IU] | Freq: Three times a day (TID) | INTRAMUSCULAR | Status: DC
  Administered 2022-08-12 (×2): 3 [IU] via SUBCUTANEOUS

## 2022-08-11 MED ORDER — DOCUSATE SODIUM 100 MG PO CAPS
100.0000 mg | ORAL_CAPSULE | Freq: Two times a day (BID) | ORAL | Status: DC
  Administered 2022-08-11 – 2022-08-12 (×2): 100 mg via ORAL
  Filled 2022-08-11 (×2): qty 1

## 2022-08-11 MED ORDER — POLYETHYLENE GLYCOL 3350 17 G PO PACK
17.0000 g | PACK | Freq: Every day | ORAL | Status: DC
  Filled 2022-08-11: qty 1

## 2022-08-11 MED ORDER — FAMOTIDINE 20 MG PO TABS
20.0000 mg | ORAL_TABLET | Freq: Two times a day (BID) | ORAL | Status: DC
  Administered 2022-08-11 – 2022-08-12 (×2): 20 mg via ORAL
  Filled 2022-08-11 (×2): qty 1

## 2022-08-11 MED ORDER — CHLORHEXIDINE GLUCONATE CLOTH 2 % EX PADS
6.0000 | MEDICATED_PAD | Freq: Every day | CUTANEOUS | Status: DC
  Administered 2022-08-11: 6 via TOPICAL

## 2022-08-11 NOTE — Progress Notes (Signed)
NAME:  Michael Rowland, MRN:  [id number], DOB:  Feb 15, 1976, LOS: 0 ADMISSION DATE:  08/10/2022, CONSULTATION DATE:  08/10/22  REFERRING MD: Mercie Eon CHIEF COMPLAINT:  Angioedema   History of Present Illness:  Patient is a 47 yr old male with a past medical history of polysubstance abuse (cocaine and marijuana) and history of angioedema in 2007 who came to [facility] ED with complaints mouth and lip swelling. Patient admitted by family medicine service for angioedema and treated with initially multiple administrations of IV decadron, epinephrine, and benadryl with no relief. Patient's angioedema started to progress since admission this morning with developing difficulty swallowing secretions. ENT and PCCM was consulted to assist with airway management.   Pertinent  Medical History  Drug abuse  Angioedema (2007)   Significant Hospital Events: Including procedures, antibiotic start and stop dates in addition to other pertinent events   6/19 Admit with Angioedema with unknown cause - intubated for airway protection  Interim History / Subjective:   Intubated yesterday for airway protection No acute issues overnight, has required significant sedation while intubated  Objective   Blood pressure 113/77, pulse (!) 56, temperature 98.1 F (36.7 C), temperature source Oral, resp. rate 20, height 6\' 4"  (1.93 m), weight 66.6 kg, SpO2 98 %.    Vent Mode: PRVC FiO2 (%):  [40 %-100 %] 40 % Set Rate:  [20 bmp] 20 bmp Vt Set:  [690 mL] 690 mL PEEP:  [5 cmH20] 5 cmH20 Plateau Pressure:  [13 cmH20-16 cmH20] 16 cmH20   Intake/Output Summary (Last 24 hours) at 08/11/2022 4098 Last data filed at 08/11/2022 0800 Gross per 24 hour  Intake 506.08 ml  Output 550 ml  Net -43.92 ml   Filed Weights   08/10/22 1904  Weight: 66.6 kg    Examination: General: middle aged male, sedated, intubated HENT: Normocephalic, lip swelling has decreased, ET tube in place Lungs: clear to auscultation  bilaterally Cardiovascular: s1, s2 auscultated, RRR, no m/r/g  Abdomen: BS active  Extremities: no edema, moves all extremities, in soft restraints Neuro: moving all extremities, lightly sedated. GU: intact   Resolved Hospital Problem list   N/a   Assessment & Plan:   Angioedema with unknown source  Suspecting food allergy (patient ate chicken hibachi last night)-possible cross contamination with shell-fish, however has no known history of shell fish allergy or family history of angioedema  Last known use of cocaine/marijuana (6/17) per patient  P:  ENT following Lip swelling appears improved Intubated for airway control Continue scheduled IV solumedrol, benadryl, Pepcid   Airway Management Intubated for airway protection secondary to angioedema  P:  Will work on PSV trial He has air leak on exam Will likely extubated today  Substance abuse (cocaine/marijuana)  P: Provide resources for cessation when appropriate  Supportive care    Best Practice (right click and "Reselect all SmartList Selections" daily)   Diet/type: NPO DVT prophylaxis: SCD GI prophylaxis: H2B Lines: N/A Foley:  N/A Code Status:  full code Last date of multidisciplinary goals of care discussion (patient updated prior to transfer to ICU)   Labs   CBC: Recent Labs  Lab 08/10/22 0921 08/10/22 1943 08/11/22 0047  WBC 6.3  --  5.0  NEUTROABS 3.3  --   --   HGB 14.4 15.0 12.8*  HCT 43.7 44.0 38.6*  MCV 76.7*  --  74.2*  PLT 261  --  210    Basic Metabolic Panel: Recent Labs  Lab 08/10/22 0921 08/10/22 1943 08/11/22 0047  NA 131* 130* 132*  K 3.7 4.3 4.0  CL 96*  --  98  CO2 23  --  20*  GLUCOSE 94  --  122*  BUN 11  --  15  CREATININE 0.99  --  0.96  CALCIUM 9.5  --  9.6   GFR: Estimated Creatinine Clearance: 89.6 mL/min (by C-G formula based on SCr of 0.96 mg/dL). Recent Labs  Lab 08/10/22 0921 08/11/22 0047  WBC 6.3 5.0    Liver Function Tests: Recent Labs  Lab  08/10/22 0921  AST 103*  ALT 70*  ALKPHOS 79  BILITOT 1.1  PROT 8.5*  ALBUMIN 4.6   No results for input(s): "LIPASE", "AMYLASE" in the last 168 hours. No results for input(s): "AMMONIA" in the last 168 hours.  ABG    Component Value Date/Time   PHART 7.480 (H) 08/10/2022 1943   PCO2ART 27.7 (L) 08/10/2022 1943   PO2ART 269 (H) 08/10/2022 1943   HCO3 20.7 08/10/2022 1943   TCO2 21 (L) 08/10/2022 1943   ACIDBASEDEF 1.0 08/10/2022 1943   O2SAT 100 08/10/2022 1943     Coagulation Profile: No results for input(s): "INR", "PROTIME" in the last 168 hours.  Cardiac Enzymes: No results for input(s): "CKTOTAL", "CKMB", "CKMBINDEX", "TROPONINI" in the last 168 hours.  HbA1C: Hgb A1c MFr Bld  Date/Time Value Ref Range Status  08/11/2022 12:47 AM 5.5 4.8 - 5.6 % Final    Comment:    (NOTE) Pre diabetes:          5.7%-6.4%  Diabetes:              >6.4%  Glycemic control for   <7.0% adults with diabetes   11/14/2013 02:04 AM 5.2 <5.7 % Final    Comment:    (NOTE)                                                                       According to the ADA Clinical Practice Recommendations for 2011, when HbA1c is used as a screening test:  >=6.5%   Diagnostic of Diabetes Mellitus           (if abnormal result is confirmed) 5.7-6.4%   Increased risk of developing Diabetes Mellitus References:Diagnosis and Classification of Diabetes Mellitus,Diabetes Care,2011,34(Suppl 1):S62-S69 and Standards of Medical Care in         Diabetes - 2011,Diabetes Care,2011,34 (Suppl 1):S11-S61.    CBG: Recent Labs  Lab 08/10/22 1802 08/10/22 1902 08/10/22 2307 08/11/22 0317 08/11/22 0734  GLUCAP 162* 154* 134* 131* 121*    Review of Systems:   Please see the history of present illness. All other systems reviewed and are negative    Past Medical History:  He,  has a past medical history of Syncope (11/13/2013).   Surgical History:   Past Surgical History:  Procedure Laterality  Date   HERNIA REPAIR       Social History:   reports that he has been smoking cigarettes. He has been smoking an average of .5 packs per day. He has never used smokeless tobacco. He reports that he does not drink alcohol and does not use drugs.   Family History:  His family history is not on file.   Allergies No Known Allergies  Home Medications  Prior to Admission medications   Medication Sig Start Date End Date Taking? Authorizing Provider  diphenhydrAMINE (BENADRYL) 25 MG tablet Take 1 tablet (25 mg total) by mouth every 6 (six) hours as needed for allergies. Patient taking differently: Take 25 mg by mouth every 6 (six) hours as needed for allergies or itching. 08/30/15  Yes Benjiman Core, MD     Critical care time: 35 mins     CRITICAL CARE Performed by: Martina Sinner S-ACNP    Total critical care time: 35 minutes  Critical care time was exclusive of separately billable procedures and treating other patients.  Critical care was necessary to treat or prevent imminent or life-threatening deterioration.  Critical care was time spent personally by me on the following activities: development of treatment plan with patient and/or surrogate as well as nursing, discussions with consultants, evaluation of patient's response to treatment, examination of patient, obtaining history from patient or surrogate, ordering and performing treatments and interventions, ordering and review of laboratory studies, ordering and review of radiographic studies, pulse oximetry and re-evaluation of patient's condition.

## 2022-08-11 NOTE — Progress Notes (Signed)
Pt arrived from ..52M..., A/ox .4.Marland Kitchenpt denies any pain, MD aware,CCMD called. CHG bath given,no further needs at this time

## 2022-08-11 NOTE — Procedures (Signed)
Extubation Procedure Note  Patient Details:   Name: Michael Rowland DOB: [birth year] MRN: [id number]   Airway Documentation:    Vent end date: 08/11/22 Vent end time: 1005   Evaluation  O2 sats: stable throughout Complications: No apparent complications Patient did tolerate procedure well. Bilateral Breath Sounds: Clear   Yes  Pt extubated per physician order. Pt suctioned via ETT/orally prior, positive cuff leak. Pt extubated to 4L nasal cannula. Upon extubation, pt able to speak name, give a good cough and no stridor heard at this time. RT will continue to monitor and be available as needed.   Trilby Leaver Michael Rowland 08/11/2022, 10:07 AM

## 2022-08-12 ENCOUNTER — Other Ambulatory Visit (HOSPITAL_COMMUNITY): Payer: Self-pay

## 2022-08-12 DIAGNOSIS — J9601 Acute respiratory failure with hypoxia: Secondary | ICD-10-CM

## 2022-08-12 LAB — BASIC METABOLIC PANEL
Anion gap: 12 (ref 5–15)
BUN: 23 mg/dL — ABNORMAL HIGH (ref 6–20)
CO2: 23 mmol/L (ref 22–32)
Calcium: 9.2 mg/dL (ref 8.9–10.3)
Chloride: 96 mmol/L — ABNORMAL LOW (ref 98–111)
Creatinine, Ser: 1.04 mg/dL (ref 0.61–1.24)
GFR, Estimated: >60 mL/min (ref >60)
Glucose, Bld: 146 mg/dL — ABNORMAL HIGH (ref 70–99)
Potassium: 4.8 mmol/L (ref 3.5–5.1)
Sodium: 131 mmol/L — ABNORMAL LOW (ref 135–145)

## 2022-08-12 LAB — GLUCOSE, CAPILLARY
Glucose-Capillary: 158 mg/dL — ABNORMAL HIGH (ref 70–99)
Glucose-Capillary: 159 mg/dL — ABNORMAL HIGH (ref 70–99)

## 2022-08-12 LAB — MAGNESIUM: Magnesium: 2.4 mg/dL (ref 1.7–2.4)

## 2022-08-12 LAB — CBC
HCT: 36.6 % — ABNORMAL LOW (ref 39.0–52.0)
Hemoglobin: 11.9 g/dL — ABNORMAL LOW (ref 13.0–17.0)
MCH: 24.7 pg — ABNORMAL LOW (ref 26.0–34.0)
MCHC: 32.5 g/dL (ref 30.0–36.0)
MCV: 76.1 fL — ABNORMAL LOW (ref 80.0–100.0)
Platelets: 156 10*3/uL (ref 150–400)
RBC: 4.81 MIL/uL (ref 4.22–5.81)
RDW: 15.5 % (ref 11.5–15.5)
WBC: 14.1 10*3/uL — ABNORMAL HIGH (ref 4.0–10.5)
nRBC: 0 % (ref 0.0–0.2)

## 2022-08-12 LAB — PHOSPHORUS: Phosphorus: 3 mg/dL (ref 2.5–4.6)

## 2022-08-12 LAB — C1 ESTERASE INHIBITOR: C1INH SerPl-mCnc: 27 mg/dL (ref 21–39)

## 2022-08-12 LAB — T4, FREE: Free T4: 0.95 ng/dL (ref 0.61–1.12)

## 2022-08-12 MED ORDER — DIPHENHYDRAMINE HCL 25 MG PO TABS: 25.0000 mg | ORAL_TABLET | Freq: Every day | ORAL | 0 refills | Status: AC

## 2022-08-12 MED ORDER — PREDNISONE 10 MG PO TABS
ORAL_TABLET | ORAL | 0 refills | Status: AC
  Filled 2022-08-12: qty 36, 9d supply, fill #0

## 2022-08-12 NOTE — Progress Notes (Signed)
D/c tele and Ivs. Went over AVS with pt and all questions were addressed.   Shylyn Younce S Lalita Ebel, RN  

## 2022-08-12 NOTE — Discharge Summary (Signed)
Name: Michael Rowland MRN: [id number] DOB: [birth year] 47 y.o. PCP: Pcp, No  Date of Admission: 08/10/2022  9:05 AM Date of Discharge:  08/12/2022 Attending Physician: Dr.  Lafonda Mosses  DISCHARGE DIAGNOSIS:  Primary Problem: Angioedema   Hospital Problems: Principal Problem:   Angioedema Active Problems:   Acute respiratory failure (HCC)    DISCHARGE MEDICATIONS:   Allergies as of 08/12/2022   No Known Allergies      Medication List     TAKE these medications    diphenhydrAMINE 25 MG tablet Commonly known as: BENADRYL Take 1 tablet (25 mg total) by mouth at bedtime for 7 days. What changed:  when to take this reasons to take this   predniSONE 10 MG tablet Commonly known as: DELTASONE Take 6 tablets (60 mg total) by mouth daily for 3 days, THEN 4 tablets (40 mg total) daily for 3 days, THEN 2 tablets (20 mg total) daily for 3 days. Start taking on: August 12, 2022        DISPOSITION AND FOLLOW-UP:  MichaelMichael Rowland was discharged from [facility] in Stable condition. At the hospital follow up visit please address:  Angioedema F/u C1 esterase. Assess for recurrence of symptoms. Please ensure he is correctly following prednisone taper. Recommend close follow up of blood sugar once steroid taper has been completed.   Tobacco use disorder Polysubstance use disorder Encourage continued cessation of substance use.  Subclinical hyperthyroidism TSH very low at 0.103. F/u T4 level.  Microcytic anemia Repeat CBC and consider iron studies and referral for colonoscopy.   Hyponatremia Hypochloremia Check BMP.  Neck pain Assess for resolution. Encourage patient to establish with dentist.   Follow-up Recommendations: Consults: None Labs:  Iron studies, Basic Metabolic Profile, CBC, and T4 Studies: Consider referral for colonoscopy. Medications:   Prednisone taper: 60 mg daily 06/21-06/23 40 mg daily 06/24-06/26 20 mg daily 06/27-06/29 Benadryl  daily at bedtime for 7 days Tylenol and warm compress to R side of neck PRN pain  Follow-up Appointments:  Follow-up Information     [facility]. Schedule an appointment as soon as possible for a visit.   Why: Our clinic will call you to schedule an appointment. Contact information: [address] [place] [address] [phone]                HOSPITAL COURSE:  Patient Summary: Angioedema Patient presented to [facility] ED with CC of lip swelling that began the morning of admission. He initially did not have shortness of breath, dysphagia, urticaria, pruritus, throat itching. Patient denied prescription medications at that time but did endorse cocaine use the previous night. He did deny any history of this but in 2007 he was treated in the ED for similar concern. He was HDS on arrival. ENT was consulted who performed glidescope assessment and noted no evidence of laryngeal edema. He was given solu-medrol, epinephrine, H2 blocker, PPI, and admitted with scheduled doses of decadron. Later on day of admission he did develop the sensation that his throat was closing, difficulty managing secretions, and difficulty swallowing. He endorsed some trouble breathing. He received a dose of epinephrine, dexamethasone, benadryl, pepcid, and PCCM was consulted. He was transferred to ICU for elective intubation for airway protection. His angioedema improved and he tolerated extubation 06/20. Labs including C4, CRP, ESR were normal. C1 esterase is pending at time of discharge. Felt that this was more likely related to possible cross-contamination with an unknown food allergy. On  day of discharge he had resolution of angioedema, was able to manage secretions well, swallow safely, and had no shortness of breath.    Tobacco use disorder Polysubstance use disorder Counseled patient on cessation of all substance use.  Subclinical hyperthyroidism TSH very low at 0.103. T4  added, pending at time of discharge.  Microcytic anemia Hgb initially normal but did drop mildly to 11.9. MCV low at 76.1.   Hyponatremia Hypochloremia Likely acute, mild. Suspect 2/2 to IVF received throughout admission.   Neck pain Neck pain persisted beyond resolution of angioedema. Reported on the R anterior aspect of his neck and some in his lower jaw/gums on the R. No signs of dental infection, afebrile throughout stay. Patient does not follow with dentist OP. Not felt that there was an acute need for CT imaging.   DISCHARGE INSTRUCTIONS:   Discharge Instructions     Discharge instructions   Complete by: As directed    Michael Rowland,  It was a pleasure to care for you during your stay at [facility]. You were admitted because your face, lips, and tongue were very swollen.  When you go home, you can use tylenol and warm compress for your neck pain. I would like you to take benadryl each night for the next 1 week. You will also start a prednisone taper tonight when you get home: 06/21-06/23 60 mg once daily 06/24-06/26 40 mg daily 06/27-06/29 20 mg daily Then you will stop this medication.  Our clinic will call you to schedule a follow-up visit. I encourage you also to establish with a dentist.  Please return to the emergency room immediately if you begin to have swelling again or notice you are unable to swallow well or start noticing trouble breathing.  My best, Michael Mungo, DO   Increase activity slowly   Complete by: As directed        SUBJECTIVE:  Patient evaluated at bedside on day of discharge. His only complaint is continued pain on the R anterior aspect of his neck.  Discharge Vitals:   BP 118/76 (BP Location: Right Arm)   Pulse 66   Temp 98 F (36.7 C) (Oral)   Resp 17   Ht 6\' 4"  (1.93 m)   Wt 66.6 kg   SpO2 98%   BMI 17.87 kg/m   OBJECTIVE:  Constitutional:Well-appearing. In no acute distress. HENT:No evidence of angioedema. No edema or  inflammation of uvula. Neck:Non-tender. No thyromegaly or thyroid nodules appreciated. Hypertonic R sternocleidomastoid muscle appreciated. No lymphadenopathy. Cardio:Regular rate and rhythm. No murmurs, rubs, or gallops. Pulm:Clear to auscultation bilaterally. Normal work of breathing on room air. RUE:[id number] for extremity edema. Skin:Warm and dry. Neuro:Alert and oriented x3. No focal deficit noted. Psych:Pleasant mood and affect.   Pertinent Labs, Studies, and Procedures:     Latest Ref Rng & Units 08/12/2022    1:34 AM 08/11/2022   12:47 AM 08/10/2022    7:43 PM  CBC  WBC 4.0 - 10.5 K/uL 14.1  5.0    Hemoglobin 13.0 - 17.0 g/dL 91.4  78.2  95.6   Hematocrit 39.0 - 52.0 % 36.6  38.6  44.0   Platelets 150 - 400 K/uL 156  210         Latest Ref Rng & Units 08/12/2022    1:34 AM 08/11/2022   12:47 AM 08/10/2022    7:43 PM  CMP  Glucose 70 - 99 mg/dL 213  086    BUN 6 - 20 mg/dL 23  15    Creatinine 0.61 - 1.24 mg/dL 3.24  4.01    Sodium 027 - 145 mmol/L 131  132  130   Potassium 3.5 - 5.1 mmol/L 4.8  4.0  4.3   Chloride 98 - 111 mmol/L 96  98    CO2 22 - 32 mmol/L 23  20    Calcium 8.9 - 10.3 mg/dL 9.2  9.6      DG Chest Port 1 View  Result Date: 08/11/2022 CLINICAL DATA:  Angioedema. EXAM: PORTABLE CHEST 1 VIEW COMPARISON:  08/10/2022 FINDINGS: 0458 hours. The lungs are clear without focal pneumonia, edema, pneumothorax or pleural effusion. Endotracheal tube tip is 6.4 cm above the base of the carina. NG tube tip is in the stomach. Telemetry leads overlie the chest. IMPRESSION: 1. No acute cardiopulmonary findings. 2. Endotracheal tube tip is 6.4 cm above the base of the carina. Electronically Signed   By: Kennith Center M.D.   On: 08/11/2022 07:18   DG Chest Port 1 View  Result Date: 08/10/2022 CLINICAL DATA:  Angioedema. EXAM: PORTABLE CHEST 1 VIEW COMPARISON:  November 14, 2014. FINDINGS: The heart size and mediastinal contours are within normal limits. Endotracheal and  nasogastric tubes are in grossly good position. Both lungs are clear. The visualized skeletal structures are unremarkable. IMPRESSION: No active disease. Electronically Signed   By: Lupita Raider M.D.   On: 08/10/2022 18:11     Signed: Champ Rowland, D.O.  Internal Medicine Resident, PGY-2 [facility]  Pager: [phone]

## 2022-08-21 ENCOUNTER — Emergency Department (HOSPITAL_BASED_OUTPATIENT_CLINIC_OR_DEPARTMENT_OTHER): Payer: Commercial Managed Care - HMO

## 2022-08-21 ENCOUNTER — Emergency Department (HOSPITAL_COMMUNITY)
Admission: EM | Admit: 2022-08-21 | Discharge: 2022-08-21 | Disposition: A | Discharge status: Home / Self Care | Payer: Commercial Managed Care - HMO | Attending: Emergency Medicine | Admitting: Emergency Medicine

## 2022-08-21 ENCOUNTER — Other Ambulatory Visit: Payer: Self-pay

## 2022-08-21 DIAGNOSIS — M79651 Pain in right thigh: Secondary | ICD-10-CM | POA: Diagnosis present

## 2022-08-21 DIAGNOSIS — M79605 Pain in left leg: Secondary | ICD-10-CM | POA: Diagnosis not present

## 2022-08-21 DIAGNOSIS — M79604 Pain in right leg: Secondary | ICD-10-CM | POA: Diagnosis not present

## 2022-08-21 DIAGNOSIS — F17200 Nicotine dependence, unspecified, uncomplicated: Secondary | ICD-10-CM | POA: Insufficient documentation

## 2022-08-21 DIAGNOSIS — D72829 Elevated white blood cell count, unspecified: Secondary | ICD-10-CM | POA: Diagnosis not present

## 2022-08-21 DIAGNOSIS — M79661 Pain in right lower leg: Secondary | ICD-10-CM

## 2022-08-21 LAB — CBC WITH DIFFERENTIAL/PLATELET
Abs Immature Granulocytes: 0.08 10*3/uL — ABNORMAL HIGH (ref 0.00–0.07)
Basophils Absolute: 0 10*3/uL (ref 0.0–0.1)
Basophils Relative: 0 %
Eosinophils Absolute: 0 10*3/uL (ref 0.0–0.5)
Eosinophils Relative: 0 %
HCT: 32.6 % — ABNORMAL LOW (ref 39.0–52.0)
Hemoglobin: 10.7 g/dL — ABNORMAL LOW (ref 13.0–17.0)
Immature Granulocytes: 1 %
Lymphocytes Relative: 9 %
Lymphs Abs: 1.2 10*3/uL (ref 0.7–4.0)
MCH: 25.2 pg — ABNORMAL LOW (ref 26.0–34.0)
MCHC: 32.8 g/dL (ref 30.0–36.0)
MCV: 76.7 fL — ABNORMAL LOW (ref 80.0–100.0)
Monocytes Absolute: 0.5 10*3/uL (ref 0.1–1.0)
Monocytes Relative: 4 %
Neutro Abs: 10.5 10*3/uL — ABNORMAL HIGH (ref 1.7–7.7)
Neutrophils Relative %: 86 %
Platelets: 337 10*3/uL (ref 150–400)
RBC: 4.25 MIL/uL (ref 4.22–5.81)
RDW: 15.8 % — ABNORMAL HIGH (ref 11.5–15.5)
WBC: 12.2 10*3/uL — ABNORMAL HIGH (ref 4.0–10.5)
nRBC: 0 % (ref 0.0–0.2)

## 2022-08-21 LAB — BASIC METABOLIC PANEL
Anion gap: 9 (ref 5–15)
BUN: 12 mg/dL (ref 6–20)
CO2: 24 mmol/L (ref 22–32)
Calcium: 8.7 mg/dL — ABNORMAL LOW (ref 8.9–10.3)
Chloride: 101 mmol/L (ref 98–111)
Creatinine, Ser: 0.7 mg/dL (ref 0.61–1.24)
GFR, Estimated: >60 mL/min (ref >60)
Glucose, Bld: 115 mg/dL — ABNORMAL HIGH (ref 70–99)
Potassium: 3.9 mmol/L (ref 3.5–5.1)
Sodium: 134 mmol/L — ABNORMAL LOW (ref 135–145)

## 2022-08-21 LAB — URINALYSIS, ROUTINE W REFLEX MICROSCOPIC
Bilirubin Urine: NEGATIVE
Glucose, UA: NEGATIVE mg/dL
Hgb urine dipstick: NEGATIVE
Ketones, ur: NEGATIVE mg/dL
Leukocytes,Ua: NEGATIVE
Nitrite: NEGATIVE
Protein, ur: NEGATIVE mg/dL
Specific Gravity, Urine: 1.009 (ref 1.005–1.030)
pH: 6 (ref 5.0–8.0)

## 2022-08-21 LAB — CK: Total CK: 121 U/L (ref 49–397)

## 2022-08-21 MED ORDER — SODIUM CHLORIDE 0.9 % IV BOLUS
1000.0000 mL | Freq: Once | INTRAVENOUS | Status: AC
  Administered 2022-08-21: 1000 mL via INTRAVENOUS

## 2022-08-21 MED ORDER — METHOCARBAMOL 500 MG PO TABS
500.0000 mg | ORAL_TABLET | Freq: Once | ORAL | Status: AC
  Administered 2022-08-21: 500 mg via ORAL
  Filled 2022-08-21: qty 1

## 2022-08-21 MED ORDER — METHOCARBAMOL 500 MG PO TABS: 500.0000 mg | ORAL_TABLET | Freq: Two times a day (BID) | ORAL | 0 refills | Status: DC

## 2022-08-21 NOTE — ED Provider Notes (Signed)
[facility] Provider Note   CSN: [id number] Arrival date & time: 08/21/22  0416     History  Chief Complaint  Patient presents with   Leg Pain    Michael Rowland is a 47 y.o. male.  Patient complains of bilateral thigh pain.  He was recently admitted to the hospital for angioedema thought to be due to a shellfish allergy.  He was discharged on Benadryl and prednisone.  He states that starting over the past 2 days he has noticed pain in the upper thighs bilaterally.  He denies shortness of breath, chest pain, trauma/injury to area, increased activity.  He was intubated electively during the admission was bedbound for 2 days.  Patient is an everyday tobacco user.  Past medical history otherwise significant for episodes of syncope  HPI     Home Medications Prior to Admission medications   Medication Sig Start Date End Date Taking? Authorizing Provider  methocarbamol (ROBAXIN) 500 MG tablet Take 1 tablet (500 mg total) by mouth 2 (two) times daily. 08/21/22  Yes Darrick Grinder, PA-C  diphenhydrAMINE (BENADRYL) 25 MG tablet Take 1 tablet (25 mg total) by mouth at bedtime for 7 days. 08/12/22 08/19/22  Champ Mungo, DO  predniSONE (DELTASONE) 10 MG tablet Take 6 tablets (60 mg total) by mouth daily for 3 days, THEN 4 tablets (40 mg total) daily for 3 days, THEN 2 tablets (20 mg total) daily for 3 days. 08/12/22 08/21/22  Champ Mungo, DO      Allergies    Patient has no known allergies.    Review of Systems   Review of Systems  Physical Exam Updated Vital Signs BP (!) 135/112   Pulse 77   Temp 98.1 F (36.7 C)   Resp 18   Ht 6\' 4"  (1.93 m)   Wt 70.3 kg   SpO2 99%   BMI 18.87 kg/m  Physical Exam Vitals and nursing note reviewed.  Constitutional:      General: He is not in acute distress.    Appearance: He is well-developed.  HENT:     Head: Normocephalic and atraumatic.  Eyes:     Conjunctiva/sclera: Conjunctivae normal.   Cardiovascular:     Rate and Rhythm: Normal rate and regular rhythm.     Heart sounds: No murmur heard. Pulmonary:     Effort: Pulmonary effort is normal. No respiratory distress.     Breath sounds: Normal breath sounds.  Abdominal:     Palpations: Abdomen is soft.     Tenderness: There is no abdominal tenderness.  Musculoskeletal:        General: Tenderness present. No swelling.     Cervical back: Neck supple.     Comments: Bilateral legs tender to palpation in medial thigh region, no significant swelling in bilateral calfs, no calf tenderness  Skin:    General: Skin is warm and dry.     Capillary Refill: Capillary refill takes less than 2 seconds.  Neurological:     Mental Status: He is alert.  Psychiatric:        Mood and Affect: Mood normal.     ED Results / Procedures / Treatments   Labs (all labs ordered are listed, but only abnormal results are displayed) Labs Reviewed  CBC WITH DIFFERENTIAL/PLATELET - Abnormal; Notable for the following components:      Result Value   WBC 12.2 (*)    Hemoglobin 10.7 (*)    HCT 32.6 (*)  MCV 76.7 (*)    MCH 25.2 (*)    RDW 15.8 (*)    Neutro Abs 10.5 (*)    Abs Immature Granulocytes 0.08 (*)    All other components within normal limits  BASIC METABOLIC PANEL - Abnormal; Notable for the following components:   Sodium 134 (*)    Glucose, Bld 115 (*)    Calcium 8.7 (*)    All other components within normal limits  URINALYSIS, ROUTINE W REFLEX MICROSCOPIC - Abnormal; Notable for the following components:   Color, Urine STRAW (*)    All other components within normal limits  CK    EKG None  Radiology VAS [place] LOWER EXTREMITY VENOUS (DVT) (7a-7p)  Result Date: 08/21/2022  Lower Venous DVT Study Patient Name:  ARYE ESPEJO Verdell  Date of Exam:   08/21/2022 Medical Rec #: [id number]       Accession #:    [id number] Date of Birth: [birth year]       Patient Gender: M Patient Age:   73 years Exam Location:  [facility] Procedure:       VAS [place] LOWER EXTREMITY VENOUS (DVT) Referring Phys: Barrie Dunker --------------------------------------------------------------------------------  Indications: Pain.  Risk Factors: None identified. Comparison Study: No prior studies. Performing Technologist: Chanda Busing RVT  Examination Guidelines: A complete evaluation includes B-mode imaging, spectral Doppler, color Doppler, and power Doppler as needed of all accessible portions of each vessel. Bilateral testing is considered an integral part of a complete examination. Limited examinations for reoccurring indications may be performed as noted. The reflux portion of the exam is performed with the patient in reverse Trendelenburg.  +---------+---------------+---------+-----------+----------+--------------+ RIGHT    CompressibilityPhasicitySpontaneityPropertiesThrombus Aging +---------+---------------+---------+-----------+----------+--------------+ CFV      Full           Yes      Yes                                 +---------+---------------+---------+-----------+----------+--------------+ SFJ      Full                                                        +---------+---------------+---------+-----------+----------+--------------+ FV Prox  Full                                                        +---------+---------------+---------+-----------+----------+--------------+ FV Mid   Full                                                        +---------+---------------+---------+-----------+----------+--------------+ FV DistalFull                                                        +---------+---------------+---------+-----------+----------+--------------+ PFV      Full                                                        +---------+---------------+---------+-----------+----------+--------------+  POP      Full           Yes      Yes                                  +---------+---------------+---------+-----------+----------+--------------+ PTV      Full                                                        +---------+---------------+---------+-----------+----------+--------------+ PERO     Full                                                        +---------+---------------+---------+-----------+----------+--------------+   +---------+---------------+---------+-----------+----------+--------------+ LEFT     CompressibilityPhasicitySpontaneityPropertiesThrombus Aging +---------+---------------+---------+-----------+----------+--------------+ CFV      Full           Yes      Yes                                 +---------+---------------+---------+-----------+----------+--------------+ SFJ      Full                                                        +---------+---------------+---------+-----------+----------+--------------+ FV Prox  Full                                                        +---------+---------------+---------+-----------+----------+--------------+ FV Mid   Full                                                        +---------+---------------+---------+-----------+----------+--------------+ FV DistalFull                                                        +---------+---------------+---------+-----------+----------+--------------+ PFV      Full                                                        +---------+---------------+---------+-----------+----------+--------------+ POP      Full           Yes      Yes                                 +---------+---------------+---------+-----------+----------+--------------+  PTV      Full                                                        +---------+---------------+---------+-----------+----------+--------------+ PERO     Full                                                         +---------+---------------+---------+-----------+----------+--------------+     Summary: RIGHT: - There is no evidence of deep vein thrombosis in the lower extremity.  - No cystic structure found in the popliteal fossa.  LEFT: - There is no evidence of deep vein thrombosis in the lower extremity.  - No cystic structure found in the popliteal fossa.  *See table(s) above for measurements and observations.    Preliminary     Procedures Procedures    Medications Ordered in ED Medications  sodium chloride 0.9 % bolus 1,000 mL (1,000 mLs Intravenous New Bag/Given 08/21/22 0722)  methocarbamol (ROBAXIN) tablet 500 mg (500 mg Oral Given by Other 08/21/22 1610)    ED Course/ Medical Decision Making/ A&P                             Medical Decision Making Amount and/or Complexity of Data Reviewed Labs: ordered.  Risk Prescription drug management.   This patient presents to the ED for concern of leg pain, this involves an extensive number of treatment options, and is a complaint that carries with it a high risk of complications and morbidity.  The differential diagnosis includes muscle strain, muscle cramps, DVT, others   Co morbidities that complicate the patient evaluation  Recent hospitalization due to angioedema   Additional history obtained:   External records from outside source obtained and reviewed including discharge summary from last week   Lab Tests:  I Ordered, and personally interpreted labs.  The pertinent results include: Normal CK, WBC 12.2 (patient on steroids), grossly unremarkable BMP, UA   Imaging Studies ordered:  I ordered imaging studies including DVT study of bilateral lower extremities I independently visualized and interpreted imaging which showed no evidence of DVT I agree with the radiologist interpretation    Problem List / ED Course / Critical interventions / Medication management   I ordered medication including Robaxin for muscle cramps, normal  saline for fluid resuscitation Reevaluation of the patient after these medicines showed that the patient improved I have reviewed the patients home medicines and have made adjustments as needed    Test / Admission - Considered:  Patient with improvement in symptoms with Robaxin administration.  No signs of DVT on imaging.  No trauma history to suggest fracture or dislocation.  Symptoms seem most consistent with muscle cramping in bilateral legs.  Plan to have patient follow-up with primary care.  Will prescribe Robaxin for symptom relief.  Patient advised that Robaxin will cause dizziness.  Return precautions provided.         Final Clinical Impression(s) / ED Diagnoses Final diagnoses:  Bilateral leg pain    Rx / DC Orders ED Discharge Orders          Ordered  methocarbamol (ROBAXIN) 500 MG tablet  2 times daily        08/21/22 0946              Darrick Grinder, PA-C 08/21/22 1610    Terald Sleeper, MD 08/21/22 1235

## 2022-08-21 NOTE — Discharge Instructions (Signed)
You were evaluated today for bilateral leg pain.  There was no deep vein thrombosis on your ultrasound today.  You may be experiencing muscle cramps.  I have prescribed Robaxin which is a muscle relaxant.  Please take as directed.  I do not recommend operating a motor vehicle while taking this medication as it does typically cause drowsiness.  Please follow-up with your primary care team for further evaluation and management.  If you develop any life-threatening symptoms please return to the emergency department.

## 2022-08-21 NOTE — ED Triage Notes (Signed)
Patient coming to ED for evaluation of bilateral upper leg pain.  No reports on injury.  States "it feels like the pain is pulsating."  Reports pain "was so bad last night that I was unable to sleep."

## 2022-08-21 NOTE — Progress Notes (Signed)
Bilateral lower extremity venous duplex has been completed. Preliminary results can be found in CV Proc through chart review.  Results were given to Barrie Dunker PA.  08/21/22 9:21 AM Olen Cordial RVT

## 2022-08-24 ENCOUNTER — Encounter: Payer: Commercial Managed Care - HMO | Admitting: Student

## 2022-09-01 ENCOUNTER — Encounter: Payer: Self-pay | Admitting: Student

## 2022-09-01 ENCOUNTER — Ambulatory Visit (INDEPENDENT_AMBULATORY_CARE_PROVIDER_SITE_OTHER): Payer: Commercial Managed Care - HMO | Admitting: Student

## 2022-09-01 ENCOUNTER — Other Ambulatory Visit: Payer: Self-pay

## 2022-09-01 VITALS — BP 123/82 | HR 72 | Temp 98.5°F | Resp 28 | Ht 73.5 in | Wt 152.0 lb

## 2022-09-01 DIAGNOSIS — Z87898 Personal history of other specified conditions: Secondary | ICD-10-CM | POA: Diagnosis not present

## 2022-09-01 DIAGNOSIS — K1370 Unspecified lesions of oral mucosa: Secondary | ICD-10-CM

## 2022-09-01 DIAGNOSIS — D649 Anemia, unspecified: Secondary | ICD-10-CM | POA: Diagnosis not present

## 2022-09-01 DIAGNOSIS — E878 Other disorders of electrolyte and fluid balance, not elsewhere classified: Secondary | ICD-10-CM

## 2022-09-01 NOTE — Assessment & Plan Note (Addendum)
Patient following up from hospitalization where he had significant angioedema, thought to be due to possible food allergen exposure / cocaine use. Patient required intubation, treatment with diphenhydramine, high dose steroid, and epinephrine. Patient was discharged on a steroid taper. He has completed his taper but still feels his neck is a bit swollen, especially at night. Denies wheezing, CP, SOB. Endorses chronic cough.  He has a history of similar episode 5 years ago that did not require intubation. He attributes this to shellfish, but denies having a shellfish allergy and has eaten it without issue in the past.   C1 esterase inhibitor levels where pending at time of discharge and have since resulted as negative. T4 levels also pending have resulted as normal.  Plan: -Referral to Allergy / Immunology

## 2022-09-01 NOTE — Progress Notes (Signed)
Subjective:  CC: hospital follow up   HPI:  Michael Rowland is a 47 y.o. male with a past medical history stated below and presents today for hospital follow up . Please see problem based assessment and plan for additional details.  Past Medical History:  Diagnosis Date   Syncope 11/13/2013    Current Outpatient Medications on File Prior to Visit  Medication Sig Dispense Refill   diphenhydrAMINE (BENADRYL) 25 MG tablet Take 1 tablet (25 mg total) by mouth at bedtime for 7 days. 7 tablet 0   methocarbamol (ROBAXIN) 500 MG tablet Take 1 tablet (500 mg total) by mouth 2 (two) times daily. 20 tablet 0   No current facility-administered medications on file prior to visit.    No family history on file.  Social History   Socioeconomic History   Marital status: Single    Spouse name: Not on file   Number of children: Not on file   Years of education: Not on file   Highest education level: Not on file  Occupational History   Not on file  Tobacco Use   Smoking status: Every Day    Current packs/day: 0.50    Types: Cigarettes   Smokeless tobacco: Never  Substance and Sexual Activity   Alcohol use: No   Drug use: No   Sexual activity: Not on file  Other Topics Concern   Not on file  Social History Narrative   Not on file   Social Determinants of Health   Financial Resource Strain: Not on file  Food Insecurity: No Food Insecurity (09/01/2022)   Hunger Vital Sign    Worried About Running Out of Food in the Last Year: Never true    Ran Out of Food in the Last Year: Never true  Transportation Needs: No Transportation Needs (09/01/2022)   PRAPARE - Administrator, Civil Service (Medical): No    Lack of Transportation (Non-Medical): No  Physical Activity: Not on file  Stress: Not on file  Social Connections: Moderately Isolated (09/01/2022)   Social Connection and Isolation Panel [NHANES]    Frequency of Communication with Friends and Family: More than three  times a week    Frequency of Social Gatherings with Friends and Family: More than three times a week    Attends Religious Services: 1 to 4 times per year    Active Member of [employer] or Organizations: No    Attends Banker Meetings: Never    Marital Status: Never married  Intimate Partner Violence: Not At Risk (09/01/2022)   Humiliation, Afraid, Rape, and Kick questionnaire    Fear of Current or Ex-Partner: No    Emotionally Abused: No    Physically Abused: No    Sexually Abused: No    Review of Systems: ROS negative except for what is noted on the assessment and plan.  Objective:   Vitals:   09/01/22 0929 09/01/22 0933  BP: 123/82   Pulse: 72   Resp: (!) 28   Temp: 98.5 F (36.9 C)   TempSrc: Oral   SpO2: 98%   Weight: 152 lb (68.9 kg)   Height: 6\' 4"  (1.93 m) 6' 1.5" (1.867 m)    Physical Exam: Constitutional: well-appearing in no acute distress HENT: normocephalic atraumatic, mucous membranes moist Eyes: conjunctiva non-erythematous Neck: supple Cardiovascular: regular rate and rhythm, no m/r/g Pulmonary/Chest: normal work of breathing on room air, lungs clear to auscultation bilaterally Abdominal: soft, non-tender, non-distended MSK: normal bulk and tone  Skin: warm and dry Psych: normal mood and affect   Oral Exam: Bilateral Kopko lesions of the oral mucosa of the medial mandible below the molars. There appears to be erosion of the oral mucosa, and the borders of the lesion are well demarcated. Upon probing the lesions, they are firm and non fluctuant. They do not appear to be osseous or exposed bone, and have some overlying tissue.    Assessment & Plan:  Anemia Patient found to have a microcytic anemia with increased RDW at last admission. Concern for occult bleeding in setting of possible GI bleed / neoplasia. Takes tylenol, denies NSAID use, heartburn, abdominal pain, dark stools, tarry stools. Has an episode of clear emesis yesterday in setting of  nausea, but denies coffee ground emesis.  Plan: -Iron supplementation -Iron profile -Referral for Colonoscopy   Electrolyte abnormality Patient hyponatremia, hypocalcemic at time of admission. Denies numbness, tingling, cramping, weakness, or near syncope.  Plan: -Repeat BMP  History of angioedema Patient following up from hospitalization where he had significant angioedema, thought to be due to possible food allergen exposure / cocaine use. Patient required intubation, treatment with diphenhydramine, high dose steroid, and epinephrine. Patient was discharged on a steroid taper. He has completed his taper but still feels his neck is a bit swollen, especially at night. Denies wheezing, CP, SOB. Endorses chronic cough.  He has a history of similar episode 5 years ago that did not require intubation. He attributes this to shellfish, but denies having a shellfish allergy and has eaten it without issue in the past.   C1 esterase inhibitor levels where pending at time of discharge and have since resulted as negative. T4 levels also pending have resulted as normal.  Plan: -Referral to Allergy / Immunology  Oral mucosal lesion Patient has bilateral painful lesions of the medial surface of the mandible below the molars. He says this began after his intubation. He is having some pain with swallowing. Please see physical exam for more information. I believe this may be a complication of ET intubation such as mucosal erosion. We have provided the patient with contact information for dentist for further workup.     Patient seen with Dr. Julian Reil MD [facility]  PGY-1 Pager: [phone]  Phone: [phone] Date 09/01/2022  Time 10:40 AM

## 2022-09-01 NOTE — Assessment & Plan Note (Addendum)
Patient has bilateral painful lesions of the medial surface of the mandible below the molars. He says this began after his intubation. He is having some pain with swallowing. Please see physical exam for more information. I believe this may be a complication of ET intubation such as mucosal erosion. We have provided the patient with contact information for dentist for further workup.

## 2022-09-01 NOTE — Patient Instructions (Addendum)
Thank you, Michael Rowland for allowing [place] to provide your care today. Today we discussed hospital follow up.    I have ordered the following labs for you:  Lab Orders         BMP8+Anion Gap         CBC no Diff         Iron, TIBC and Ferritin Panel      Referrals ordered today:   Referral Orders         Ambulatory referral to Gastroenterology         Ambulatory referral to Allergy      I have ordered the following medication/changed the following medications:   Stop the following medications: There are no discontinued medications.   Start the following medications: No orders of the defined types were placed in this encounter.    Follow up: 6 months   We look forward to seeing you next time. Please call our clinic at [phone] if you have any questions or concerns. The best time to call is Monday-Friday from 9am-4pm, but there is someone available 24/7. If after hours or the weekend, call the main hospital number and ask for the Internal Medicine Resident On-Call. If you need medication refills, please notify your pharmacy one week in advance and they will send [place] a request.   Thank you for trusting me with your care. Wishing you the best!  Michael Macadamia MD [facility]

## 2022-09-01 NOTE — Assessment & Plan Note (Addendum)
Patient hyponatremia, hypocalcemic at time of admission. Denies numbness, tingling, cramping, weakness, or near syncope.  Plan: -Repeat BMP

## 2022-09-01 NOTE — Assessment & Plan Note (Addendum)
Patient found to have a microcytic anemia with increased RDW at last admission. Concern for occult bleeding in setting of possible GI bleed / neoplasia. Takes tylenol, denies NSAID use, heartburn, abdominal pain, dark stools, tarry stools. Has an episode of clear emesis yesterday in setting of nausea, but denies coffee ground emesis.  Plan: -Iron profile -Referral for Colonoscopy

## 2022-09-02 LAB — CBC
Hematocrit: 36.2 % — ABNORMAL LOW (ref 37.5–51.0)
Hemoglobin: 11.8 g/dL — ABNORMAL LOW (ref 13.0–17.7)
MCH: 25.5 pg — ABNORMAL LOW (ref 26.6–33.0)
MCHC: 32.6 g/dL (ref 31.5–35.7)
MCV: 78 fL — ABNORMAL LOW (ref 79–97)
Platelets: 321 10*3/uL (ref 150–450)
RBC: 4.62 x10E6/uL (ref 4.14–5.80)
RDW: 16.1 % — ABNORMAL HIGH (ref 11.6–15.4)
WBC: 5.4 10*3/uL (ref 3.4–10.8)

## 2022-09-02 LAB — BMP8+ANION GAP
Anion Gap: 16 mmol/L (ref 10.0–18.0)
BUN/Creatinine Ratio: 10 (ref 9–20)
BUN: 7 mg/dL (ref 6–24)
CO2: 23 mmol/L (ref 20–29)
Calcium: 9.8 mg/dL (ref 8.7–10.2)
Chloride: 97 mmol/L (ref 96–106)
Creatinine, Ser: 0.71 mg/dL — ABNORMAL LOW (ref 0.76–1.27)
Glucose: 79 mg/dL (ref 70–99)
Potassium: 4.7 mmol/L (ref 3.5–5.2)
Sodium: 136 mmol/L (ref 134–144)
eGFR: 114 mL/min/{1.73_m2} (ref >59)

## 2022-09-02 LAB — IRON,TIBC AND FERRITIN PANEL
Ferritin: 183 ng/mL (ref 30–400)
Iron Saturation: 61 % — ABNORMAL HIGH (ref 15–55)
Iron: 192 ug/dL — ABNORMAL HIGH (ref 38–169)
Total Iron Binding Capacity: 315 ug/dL (ref 250–450)
UIBC: 123 ug/dL (ref 111–343)

## 2022-09-07 NOTE — Progress Notes (Signed)
Internal Medicine Clinic Attending  I was physically present during the key portions of the resident provided service and participated in the medical decision making of patient's management care. I reviewed pertinent patient test results.  The assessment, diagnosis, and plan were formulated together and I agree with the documentation in the resident's note.  Lau, Grace, MD  

## 2022-09-08 ENCOUNTER — Telehealth: Payer: Self-pay | Admitting: Student

## 2022-09-08 NOTE — Telephone Encounter (Signed)
-----   Message from Dickie La sent at 09/06/2022  5:34 PM EDT ----- I agree with these next steps! Thanks Iasia Forcier. 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 Would recommend peripheral blood smear or electrophoresis at next visit. I would not recommend iron supplementation at current time d/t results of iron panel. ----- Message ----- From: Interface, [facility] Lab Results In Sent: 09/02/2022   6:37 AM EDT To: Lovie Macadamia, MD

## 2022-09-08 NOTE — Telephone Encounter (Signed)
Attempted to call the patient on the phone today regarding their lab results.  The patient's aunt Mrs. Jones Skene picked up, stated that the patient has not been around recently.  I informed Mrs. Donavan Foil to please call back to the [facility] internal medicine clinic to discuss results when possible.

## 2022-09-08 NOTE — Telephone Encounter (Signed)
-----   Message from Dickie La sent at 09/06/2022  5:34 PM EDT ----- I agree with these next steps! Thanks . 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 Would recommend peripheral blood smear or electrophoresis at next visit. I would not recommend iron supplementation at current time d/t results of iron panel. ----- Message ----- From: Interface, [facility] Lab Results In Sent: 09/02/2022   6:37 AM EDT To: Lovie Macadamia, MD

## 2022-09-13 ENCOUNTER — Telehealth: Payer: Self-pay | Admitting: Student

## 2022-09-13 NOTE — Telephone Encounter (Signed)
Attempted to call patient. Spoke with Ms. Donavan Foil, left voicemail

## 2023-08-07 ENCOUNTER — Encounter: Payer: Self-pay | Admitting: *Deleted
